# Patient Record
Sex: Female | Born: 1995 | Race: Asian | Hispanic: No | Marital: Single | Smoking: Never smoker
Health system: Southern US, Community
[De-identification: ages and names within clinical notes are randomized; demographics above are authoritative.]

## PROBLEM LIST (undated history)

## (undated) DIAGNOSIS — Z789 Other specified health status: Secondary | ICD-10-CM

## (undated) HISTORY — PX: NO PAST SURGERIES: SHX2092

## (undated) HISTORY — DX: Other specified health status: Z78.9

---

## 2014-08-20 NOTE — L&D Delivery Note (Cosign Needed)
Delivery Note At 2:30 PM a viable female was delivered via Vaginal, Spontaneous Delivery (Presentation: Left Occiput Anterior).  APGAR: 7, 9; weight 6 lb 9.5 oz (2990 g).   Placenta status: Intact, Spontaneous.  Cord: 3 vessels with the following complications: meconium-stained fluid.  Cord pH: not obtained  Anesthesia: Epidural  Episiotomy: None Lacerations: 1st degree Suture Repair: 3-0 vicryl for perineal laceration and 4-0 vicryl for right labial laceration Est. Blood Loss (mL): 153  Mom to postpartum.  Baby to Couplet care / Skin to Skin.  Cherrie Gauze Ayvin Lipinski 04/20/2015, 5:24 PM

## 2014-09-29 ENCOUNTER — Encounter: Payer: Self-pay | Admitting: General Practice

## 2014-09-29 ENCOUNTER — Telehealth: Payer: Self-pay | Admitting: General Practice

## 2014-09-29 NOTE — Telephone Encounter (Signed)
Patient needs to be made aware of new OB appt. Called patient at listed number and a woman answered stating she was her personal interpreter because Zyasia doesn't speak english. Asked her for patient's phone number and she states that the patient isn't going to want to use any other interpreter because she has already said that. Told her that if the patient wants to do that, that is fine but we can only speak to her using our provided interpreters at this time due to HIPAA. She provided number of [phone] and states patient is in class from 10am-2pm.

## 2014-10-06 ENCOUNTER — Encounter: Payer: Self-pay | Admitting: *Deleted

## 2014-10-06 NOTE — Telephone Encounter (Signed)
[facility] # [phone] and informed her of appt on 3/10 @ 1005. Pt was also advised that a qualified medical interpreter will be provided for her either in person or by phone at each scheduled visit.  Pt voiced understanding of all information given.

## 2014-10-28 ENCOUNTER — Ambulatory Visit (INDEPENDENT_AMBULATORY_CARE_PROVIDER_SITE_OTHER): Payer: Self-pay | Admitting: Family Medicine

## 2014-10-28 ENCOUNTER — Encounter: Payer: Self-pay | Admitting: Family Medicine

## 2014-10-28 ENCOUNTER — Other Ambulatory Visit: Payer: Self-pay | Admitting: Family Medicine

## 2014-10-28 VITALS — BP 106/64 | HR 89 | Temp 98.1°F | Ht 64.0 in | Wt 121.8 lb

## 2014-10-28 DIAGNOSIS — Z3400 Encounter for supervision of normal first pregnancy, unspecified trimester: Secondary | ICD-10-CM | POA: Insufficient documentation

## 2014-10-28 DIAGNOSIS — Z3402 Encounter for supervision of normal first pregnancy, second trimester: Secondary | ICD-10-CM

## 2014-10-28 LAB — POCT URINALYSIS DIP (DEVICE)
Glucose, UA: NEGATIVE mg/dL
Ketones, ur: NEGATIVE mg/dL
Leukocytes, UA: NEGATIVE
Nitrite: NEGATIVE
Protein, ur: 30 mg/dL — AB
SPECIFIC GRAVITY, URINE: 1.025 (ref 1.005–1.030)
UROBILINOGEN UA: 0.2 mg/dL (ref 0.0–1.0)
pH: 7 (ref 5.0–8.0)

## 2014-10-28 LAB — OB RESULTS CONSOLE GC/CHLAMYDIA
Chlamydia: NEGATIVE
GC PROBE AMP, GENITAL: NEGATIVE

## 2014-10-28 NOTE — Progress Notes (Signed)
   Subjective:    Alicia Hutchinson is a G1P0000 5524w2d being seen today for her first obstetrical visit.  Her obstetrical history is not significant. Pregnancy history fully reviewed.  Patient reports no complaints.  Filed Vitals:   10/28/14 1036 10/28/14 1038  BP: 106/64   Pulse: 89   Temp: 98.1 F (36.7 C)   Height:  5\' 4"  (1.626 m)  Weight: 121 lb 12.8 oz (55.248 kg)     HISTORY: OB History  Gravida Para Term Preterm AB SAB TAB Ectopic Multiple Living  1 0 0 0 0 0 0 0 0 0     # Outcome Date GA Lbr Len/2nd Weight Sex Delivery Anes PTL Lv  1 Current              Past Medical History  Diagnosis Date  . Medical history non-contributory    Past Surgical History  Procedure Laterality Date  . No past surgeries     History reviewed. No pertinent family history.   Exam     Skin: normal coloration and turgor, no rashes    Neurologic: oriented   Extremities: normal strength, tone, and muscle mass   HEENT sclera clear, anicteric   Mouth/Teeth mucous membranes moist, pharynx normal without lesions and dental hygiene good   Neck supple   Cardiovascular: regular rate and rhythm, no murmurs or gallops   Respiratory:  appears well, vitals normal, no respiratory distress, acyanotic, normal RR, ear and throat exam is normal, neck free of mass or lymphadenopathy, chest clear, no wheezing, crepitations, rhonchi, normal symmetric air entry   Abdomen: soft, non-tender; bowel sounds normal; no masses,  no organomegaly      Assessment:    Pregnancy: G1P0000 Patient Active Problem List   Diagnosis Date Noted  . Supervision of normal first pregnancy, antepartum 10/28/2014        Plan:     Initial labs drawn. Prenatal vitamins. Problem list reviewed and updated. Genetic Screening discussed Quad Screen: undecided. Too late for first screen-->if desires quad at next visit Ultrasound discussed; fetal survey: discussed--order next visit. Declines flu today-->if consents, give at next  visit.  Follow up in 4 weeks.    Ariyannah Pauling S 10/28/2014

## 2014-10-28 NOTE — Progress Notes (Signed)
[place]ou used for interpreter

## 2014-10-28 NOTE — Patient Instructions (Signed)
Second Trimester of Pregnancy The second trimester is from week 13 through week 28, months 4 through 6. The second trimester is often a time when you feel your best. Your body has also adjusted to being pregnant, and you begin to feel better physically. Usually, morning sickness has lessened or quit completely, you may have more energy, and you may have an increase in appetite. The second trimester is also a time when the fetus is growing rapidly. At the end of the sixth month, the fetus is about 9 inches long and weighs about 1 pounds. You will likely begin to feel the baby move (quickening) between 18 and 20 weeks of the pregnancy. BODY CHANGES Your body goes through many changes during pregnancy. The changes vary from woman to woman.   Your weight will continue to increase. You will notice your lower abdomen bulging out.  You may begin to get stretch marks on your hips, abdomen, and breasts.  You may develop headaches that can be relieved by medicines approved by your health care provider.  You may urinate more often because the fetus is pressing on your bladder.  You may develop or continue to have heartburn as a result of your pregnancy.  You may develop constipation because certain hormones are causing the muscles that push waste through your intestines to slow down.  You may develop hemorrhoids or swollen, bulging veins (varicose veins).  You may have back pain because of the weight gain and pregnancy hormones relaxing your joints between the bones in your pelvis and as a result of a shift in weight and the muscles that support your balance.  Your breasts will continue to grow and be tender.  Your gums may bleed and may be sensitive to brushing and flossing.  Dark spots or blotches (chloasma, mask of pregnancy) may develop on your face. This will likely fade after the baby is born.  A dark line from your belly button to the pubic area (linea nigra) may appear. This will likely  fade after the baby is born.  You may have changes in your hair. These can include thickening of your hair, rapid growth, and changes in texture. Some women also have hair loss during or after pregnancy, or hair that feels dry or thin. Your hair will most likely return to normal after your baby is born. WHAT TO EXPECT AT YOUR PRENATAL VISITS During a routine prenatal visit:  You will be weighed to make sure you and the fetus are growing normally.  Your blood pressure will be taken.  Your abdomen will be measured to track your baby's growth.  The fetal heartbeat will be listened to.  Any test results from the previous visit will be discussed. Your health care provider may ask you:  How you are feeling.  If you are feeling the baby move.  If you have had any abnormal symptoms, such as leaking fluid, bleeding, severe headaches, or abdominal cramping.  If you have any questions. Other tests that may be performed during your second trimester include:  Blood tests that check for:  Low iron levels (anemia).  Gestational diabetes (between 24 and 28 weeks).  Rh antibodies.  Urine tests to check for infections, diabetes, or protein in the urine.  An ultrasound to confirm the proper growth and development of the baby.  An amniocentesis to check for possible genetic problems.  Fetal screens for spina bifida and Down syndrome. HOME CARE INSTRUCTIONS   Avoid all smoking, herbs, alcohol, and unprescribed   drugs. These chemicals affect the formation and growth of the baby.  Follow your health care provider's instructions regarding medicine use. There are medicines that are either safe or unsafe to take during pregnancy.  Exercise only as directed by your health care provider. Experiencing uterine cramps is a good sign to stop exercising.  Continue to eat regular, healthy meals.  Wear a good support bra for breast tenderness.  Do not use hot tubs, steam rooms, or saunas.  Wear  your seat belt at all times when driving.  Avoid raw meat, uncooked cheese, cat litter boxes, and soil used by cats. These carry germs that can cause birth defects in the baby.  Take your prenatal vitamins.  Try taking a stool softener (if your health care provider approves) if you develop constipation. Eat more high-fiber foods, such as fresh vegetables or fruit and whole grains. Drink plenty of fluids to keep your urine clear or pale yellow.  Take warm sitz baths to soothe any pain or discomfort caused by hemorrhoids. Use hemorrhoid cream if your health care provider approves.  If you develop varicose veins, wear support hose. Elevate your feet for 15 minutes, 3-4 times a day. Limit salt in your diet.  Avoid heavy lifting, wear low heel shoes, and practice good posture.  Rest with your legs elevated if you have leg cramps or low back pain.  Visit your dentist if you have not gone yet during your pregnancy. Use a soft toothbrush to brush your teeth and be gentle when you floss.  A sexual relationship may be continued unless your health care provider directs you otherwise.  Continue to go to all your prenatal visits as directed by your health care provider. SEEK MEDICAL CARE IF:   You have dizziness.  You have mild pelvic cramps, pelvic pressure, or nagging pain in the abdominal area.  You have persistent nausea, vomiting, or diarrhea.  You have a bad smelling vaginal discharge.  You have pain with urination. SEEK IMMEDIATE MEDICAL CARE IF:   You have a fever.  You are leaking fluid from your vagina.  You have spotting or bleeding from your vagina.  You have severe abdominal cramping or pain.  You have rapid weight gain or loss.  You have shortness of breath with chest pain.  You notice sudden or extreme swelling of your face, hands, ankles, feet, or legs.  You have not felt your baby move in over an hour.  You have severe headaches that do not go away with  medicine.  You have vision changes. Document Released: 07/31/2001 Document Revised: 08/11/2013 Document Reviewed: 10/07/2012 Exit[employer] Patient Information 2015 [employer]. This information is not intended to replace advice given to you by your health care provider. Make sure you discuss any questions you have with your health care provider.  Breastfeeding Deciding to breastfeed is one of the best choices you can make for you and your baby. A change in hormones during pregnancy causes your breast tissue to grow and increases the number and size of your milk ducts. These hormones also allow proteins, sugars, and fats from your blood supply to make breast milk in your milk-producing glands. Hormones prevent breast milk from being released before your baby is born as well as prompt milk flow after birth. Once breastfeeding has begun, thoughts of your baby, as well as his or her sucking or crying, can stimulate the release of milk from your milk-producing glands.  BENEFITS OF BREASTFEEDING For Your Baby  Your first   milk (colostrum) helps your baby's digestive system function better.   There are antibodies in your milk that help your baby fight off infections.   Your baby has a lower incidence of asthma, allergies, and sudden infant death syndrome.   The nutrients in breast milk are better for your baby than infant formulas and are designed uniquely for your baby's needs.   Breast milk improves your baby's brain development.   Your baby is less likely to develop other conditions, such as childhood obesity, asthma, or type 2 diabetes mellitus.  For You   Breastfeeding helps to create a very special bond between you and your baby.   Breastfeeding is convenient. Breast milk is always available at the correct temperature and costs nothing.   Breastfeeding helps to burn calories and helps you lose the weight gained during pregnancy.   Breastfeeding makes your uterus contract to its  prepregnancy size faster and slows bleeding (lochia) after you give birth.   Breastfeeding helps to lower your risk of developing type 2 diabetes mellitus, osteoporosis, and breast or ovarian cancer later in life. SIGNS THAT YOUR BABY IS HUNGRY Early Signs of Hunger  Increased alertness or activity.  Stretching.  Movement of the head from side to side.  Movement of the head and opening of the mouth when the corner of the mouth or cheek is stroked (rooting).  Increased sucking sounds, smacking lips, cooing, sighing, or squeaking.  Hand-to-mouth movements.  Increased sucking of fingers or hands. Late Signs of Hunger  Fussing.  Intermittent crying. Extreme Signs of Hunger Signs of extreme hunger will require calming and consoling before your baby will be able to breastfeed successfully. Do not wait for the following signs of extreme hunger to occur before you initiate breastfeeding:   Restlessness.  A loud, strong cry.   Screaming. BREASTFEEDING BASICS Breastfeeding Initiation  Find a comfortable place to sit or lie down, with your neck and back well supported.  Place a pillow or rolled up blanket under your baby to bring him or her to the level of your breast (if you are seated). Nursing pillows are specially designed to help support your arms and your baby while you breastfeed.  Make sure that your baby's abdomen is facing your abdomen.   Gently massage your breast. With your fingertips, massage from your chest wall toward your nipple in a circular motion. This encourages milk flow. You may need to continue this action during the feeding if your milk flows slowly.  Support your breast with 4 fingers underneath and your thumb above your nipple. Make sure your fingers are well away from your nipple and your baby's mouth.   Stroke your baby's lips gently with your finger or nipple.   When your baby's mouth is open wide enough, quickly bring your baby to your breast,  placing your entire nipple and as much of the colored area around your nipple (areola) as possible into your baby's mouth.   More areola should be visible above your baby's upper lip than below the lower lip.   Your baby's tongue should be between his or her lower gum and your breast.   Ensure that your baby's mouth is correctly positioned around your nipple (latched). Your baby's lips should create a seal on your breast and be turned out (everted).  It is common for your baby to suck about 2-3 minutes in order to start the flow of breast milk. Latching Teaching your baby how to latch on to your breast   properly is very important. An improper latch can cause nipple pain and decreased milk supply for you and poor weight gain in your baby. Also, if your baby is not latched onto your nipple properly, Bazen or she may swallow some air during feeding. This can make your baby fussy. Burping your baby when you switch breasts during the feeding can help to get rid of the air. However, teaching your baby to latch on properly is still the best way to prevent fussiness from swallowing air while breastfeeding. Signs that your baby has successfully latched on to your nipple:    Silent tugging or silent sucking, without causing you pain.   Swallowing heard between every 3-4 sucks.    Muscle movement above and in front of his or her ears while sucking.  Signs that your baby has not successfully latched on to nipple:   Sucking sounds or smacking sounds from your baby while breastfeeding.  Nipple pain. If you think your baby has not latched on correctly, slip your finger into the corner of your baby's mouth to break the suction and place it between your baby's gums. Attempt breastfeeding initiation again. Signs of Successful Breastfeeding Signs from your baby:   A gradual decrease in the number of sucks or complete cessation of sucking.   Falling asleep.   Relaxation of his or her body.    Retention of a small amount of milk in his or her mouth.   Letting go of your breast by himself or herself. Signs from you:  Breasts that have increased in firmness, weight, and size 1-3 hours after feeding.   Breasts that are softer immediately after breastfeeding.  Increased milk volume, as well as a change in milk consistency and color by the fifth day of breastfeeding.   Nipples that are not sore, cracked, or bleeding. Signs That Your Baby is Getting Enough Milk  Wetting at least 3 diapers in a 24-hour period. The urine should be clear and pale yellow by age 5 days.  At least 3 stools in a 24-hour period by age 5 days. The stool should be soft and yellow.  At least 3 stools in a 24-hour period by age 7 days. The stool should be seedy and yellow.  No loss of weight greater than 10% of birth weight during the first 3 days of age.  Average weight gain of 4-7 ounces (113-198 g) per week after age 4 days.  Consistent daily weight gain by age 5 days, without weight loss after the age of 2 weeks. After a feeding, your baby may spit up a small amount. This is common. BREASTFEEDING FREQUENCY AND DURATION Frequent feeding will help you make more milk and can prevent sore nipples and breast engorgement. Breastfeed when you feel the need to reduce the fullness of your breasts or when your baby shows signs of hunger. This is called "breastfeeding on demand." Avoid introducing a pacifier to your baby while you are working to establish breastfeeding (the first 4-6 weeks after your baby is born). After this time you may choose to use a pacifier. Research has shown that pacifier use during the first year of a baby's life decreases the risk of sudden infant death syndrome (SIDS). Allow your baby to feed on each breast as long as Doerr or she wants. Breastfeed until your baby is finished feeding. When your baby unlatches or falls asleep while feeding from the first breast, offer the second breast.  Because newborns are often sleepy in the   first few weeks of life, you may need to awaken your baby to get him or her to feed. Breastfeeding times will vary from baby to baby. However, the following rules can serve as a guide to help you ensure that your baby is properly fed:  Newborns (babies 4 weeks of age or younger) may breastfeed every 1-3 hours.  Newborns should not go longer than 3 hours during the day or 5 hours during the night without breastfeeding.  You should breastfeed your baby a minimum of 8 times in a 24-hour period until you begin to introduce solid foods to your baby at around 6 months of age. BREAST MILK PUMPING Pumping and storing breast milk allows you to ensure that your baby is exclusively fed your breast milk, even at times when you are unable to breastfeed. This is especially important if you are going back to work while you are still breastfeeding or when you are not able to be present during feedings. Your lactation consultant can give you guidelines on how long it is safe to store breast milk.  A breast pump is a machine that allows you to pump milk from your breast into a sterile bottle. The pumped breast milk can then be stored in a refrigerator or freezer. Some breast pumps are operated by hand, while others use electricity. Ask your lactation consultant which type will work best for you. Breast pumps can be purchased, but some hospitals and breastfeeding support groups lease breast pumps on a monthly basis. A lactation consultant can teach you how to hand express breast milk, if you prefer not to use a pump.  CARING FOR YOUR BREASTS WHILE YOU BREASTFEED Nipples can become dry, cracked, and sore while breastfeeding. The following recommendations can help keep your breasts moisturized and healthy:  Avoid using soap on your nipples.   Wear a supportive bra. Although not required, special nursing bras and tank tops are designed to allow access to your breasts for  breastfeeding without taking off your entire bra or top. Avoid wearing underwire-style bras or extremely tight bras.  Air dry your nipples for 3-4minutes after each feeding.   Use only cotton bra pads to absorb leaked breast milk. Leaking of breast milk between feedings is normal.   Use lanolin on your nipples after breastfeeding. Lanolin helps to maintain your skin's normal moisture barrier. If you use pure lanolin, you do not need to wash it off before feeding your baby again. Pure lanolin is not toxic to your baby. You may also hand express a few drops of breast milk and gently massage that milk into your nipples and allow the milk to air dry. In the first few weeks after giving birth, some women experience extremely full breasts (engorgement). Engorgement can make your breasts feel heavy, warm, and tender to the touch. Engorgement peaks within 3-5 days after you give birth. The following recommendations can help ease engorgement:  Completely empty your breasts while breastfeeding or pumping. You may want to start by applying warm, moist heat (in the shower or with warm water-soaked hand towels) just before feeding or pumping. This increases circulation and helps the milk flow. If your baby does not completely empty your breasts while breastfeeding, pump any extra milk after Brine or she is finished.  Wear a snug bra (nursing or regular) or tank top for 1-2 days to signal your body to slightly decrease milk production.  Apply ice packs to your breasts, unless this is too uncomfortable for you.    Make sure that your baby is latched on and positioned properly while breastfeeding. If engorgement persists after 48 hours of following these recommendations, contact your health care provider or a lactation consultant. OVERALL HEALTH CARE RECOMMENDATIONS WHILE BREASTFEEDING  Eat healthy foods. Alternate between meals and snacks, eating 3 of each per day. Because what you eat affects your breast milk,  some of the foods may make your baby more irritable than usual. Avoid eating these foods if you are sure that they are negatively affecting your baby.  Drink milk, fruit juice, and water to satisfy your thirst (about 10 glasses a day).   Rest often, relax, and continue to take your prenatal vitamins to prevent fatigue, stress, and anemia.  Continue breast self-awareness checks.  Avoid chewing and smoking tobacco.  Avoid alcohol and drug use. Some medicines that may be harmful to your baby can pass through breast milk. It is important to ask your health care provider before taking any medicine, including all over-the-counter and prescription medicine as well as vitamin and herbal supplements. It is possible to become pregnant while breastfeeding. If birth control is desired, ask your health care provider about options that will be safe for your baby. SEEK MEDICAL CARE IF:   You feel like you want to stop breastfeeding or have become frustrated with breastfeeding.  You have painful breasts or nipples.  Your nipples are cracked or bleeding.  Your breasts are red, tender, or warm.  You have a swollen area on either breast.  You have a fever or chills.  You have nausea or vomiting.  You have drainage other than breast milk from your nipples.  Your breasts do not become full before feedings by the fifth day after you give birth.  You feel sad and depressed.  Your baby is too sleepy to eat well.  Your baby is having trouble sleeping.   Your baby is wetting less than 3 diapers in a 24-hour period.  Your baby has less than 3 stools in a 24-hour period.  Your baby's skin or the white part of his or her eyes becomes yellow.   Your baby is not gaining weight by 5 days of age. SEEK IMMEDIATE MEDICAL CARE IF:   Your baby is overly tired (lethargic) and does not want to wake up and feed.  Your baby develops an unexplained fever. Document Released: 08/06/2005 Document Revised:  08/11/2013 Document Reviewed: 01/28/2013 [employer] Patient Information 2015 [employer]. This information is not intended to replace advice given to you by your health care provider. Make sure you discuss any questions you have with your health care provider.  

## 2014-10-29 LAB — PRENATAL PROFILE (SOLSTAS)
ANTIBODY SCREEN: NEGATIVE
BASOS ABS: 0 10*3/uL (ref 0.0–0.1)
Basophils Relative: 0 % (ref 0–1)
EOS PCT: 0 % (ref 0–5)
Eosinophils Absolute: 0 10*3/uL (ref 0.0–0.7)
HCT: 39.6 % (ref 36.0–46.0)
HEP B S AG: NEGATIVE
HIV 1&2 Ab, 4th Generation: NONREACTIVE
Hemoglobin: 13.4 g/dL (ref 12.0–15.0)
LYMPHS ABS: 1.7 10*3/uL (ref 0.7–4.0)
Lymphocytes Relative: 15 % (ref 12–46)
MCH: 32.1 pg (ref 26.0–34.0)
MCHC: 33.8 g/dL (ref 30.0–36.0)
MCV: 95 fL (ref 78.0–100.0)
MPV: 10.4 fL (ref 8.6–12.4)
Monocytes Absolute: 0.8 10*3/uL (ref 0.1–1.0)
Monocytes Relative: 7 % (ref 3–12)
NEUTROS ABS: 9 10*3/uL — AB (ref 1.7–7.7)
NEUTROS PCT: 78 % — AB (ref 43–77)
PLATELETS: 209 10*3/uL (ref 150–400)
RBC: 4.17 MIL/uL (ref 3.87–5.11)
RDW: 13.5 % (ref 11.5–15.5)
Rh Type: POSITIVE
Rubella: 1.29 Index — ABNORMAL HIGH (ref <0.90)
WBC: 11.6 10*3/uL — ABNORMAL HIGH (ref 4.0–10.5)

## 2014-10-29 LAB — GC/CHLAMYDIA PROBE AMP
CT Probe RNA: NEGATIVE
GC Probe RNA: NEGATIVE

## 2014-10-29 LAB — CULTURE, OB URINE
Colony Count: NO GROWTH
Organism ID, Bacteria: NO GROWTH

## 2014-11-02 LAB — PRESCRIPTION MONITORING PROFILE (19 PANEL)
Amphetamine/Meth: NEGATIVE ng/mL
Barbiturate Screen, Urine: NEGATIVE ng/mL
Benzodiazepine Screen, Urine: NEGATIVE ng/mL
Buprenorphine, Urine: NEGATIVE ng/mL
CANNABINOID SCRN UR: NEGATIVE ng/mL
CARISOPRODOL, URINE: NEGATIVE ng/mL
COCAINE METABOLITES: NEGATIVE ng/mL
Creatinine, Urine: 446.68 mg/dL (ref >20.0)
ECSTASY: NEGATIVE ng/mL
FENTANYL URINE: NEGATIVE ng/mL
MEPERIDINE UR: NEGATIVE ng/mL
Methadone Screen, Urine: NEGATIVE ng/mL
Methaqualone: NEGATIVE ng/mL
Nitrites, Initial: NEGATIVE ug/mL
OXYCODONE SCRN UR: NEGATIVE ng/mL
Opiate Screen, Urine: NEGATIVE ng/mL
PHENCYCLIDINE, UR: NEGATIVE ng/mL
Propoxyphene: NEGATIVE ng/mL
Tapentadol, urine: NEGATIVE ng/mL
Tramadol Scrn, Ur: NEGATIVE ng/mL
Zolpidem, Urine: NEGATIVE ng/mL
pH, Initial: 6.7 pH (ref 4.5–8.9)

## 2014-11-04 ENCOUNTER — Encounter: Payer: Self-pay | Admitting: Advanced Practice Midwife

## 2014-11-19 ENCOUNTER — Encounter: Payer: Self-pay | Admitting: Advanced Practice Midwife

## 2014-11-24 ENCOUNTER — Ambulatory Visit (INDEPENDENT_AMBULATORY_CARE_PROVIDER_SITE_OTHER): Payer: Self-pay | Admitting: Advanced Practice Midwife

## 2014-11-24 VITALS — BP 109/65 | HR 71 | Temp 98.0°F | Wt 126.3 lb

## 2014-11-24 DIAGNOSIS — Z23 Encounter for immunization: Secondary | ICD-10-CM

## 2014-11-24 DIAGNOSIS — Z3402 Encounter for supervision of normal first pregnancy, second trimester: Secondary | ICD-10-CM

## 2014-11-24 LAB — POCT URINALYSIS DIP (DEVICE)
Bilirubin Urine: NEGATIVE
GLUCOSE, UA: NEGATIVE mg/dL
Hgb urine dipstick: NEGATIVE
Ketones, ur: NEGATIVE mg/dL
Leukocytes, UA: NEGATIVE
Nitrite: NEGATIVE
PROTEIN: NEGATIVE mg/dL
Specific Gravity, Urine: 1.015 (ref 1.005–1.030)
UROBILINOGEN UA: 0.2 mg/dL (ref 0.0–1.0)
pH: 7 (ref 5.0–8.0)

## 2014-11-24 NOTE — Progress Notes (Signed)
Flu vaccine today 

## 2014-11-24 NOTE — Progress Notes (Signed)
Doing well.  Good fetal movement, denies vaginal bleeding, LOF, cramping/contractions.  Reports skin breakouts on her face and cramping of her thighs.  Recommend increased water intake, stretching/warm bath before bed if cramping at night.  If worsening, let clinic know, may add magnesium supplement for leg cramping.

## 2014-11-25 ENCOUNTER — Encounter: Payer: Self-pay | Admitting: Family Medicine

## 2014-11-25 ENCOUNTER — Telehealth: Payer: Self-pay

## 2014-11-25 NOTE — Telephone Encounter (Signed)
Called patient in regards to my chart message which stated she has been experiencing sore throat, headache, muscle aches, and chest tightness since yesterday after receiving the flu shot. Patient reports she is feeling "much better" and symptoms have subsided. Informed her muscle aches, headaches are normal side effects to flu vaccine. However, advised that chest tightness and SOB are typically not-- therefore if it occurs again she should be evaluated. Patient verbalized understanding and gratitude and re-itterated that she is feeling better. No further questions or concerns.

## 2014-11-30 ENCOUNTER — Ambulatory Visit (HOSPITAL_COMMUNITY)
Admission: RE | Admit: 2014-11-30 | Discharge: 2014-11-30 | Disposition: A | Discharge status: Home / Self Care | Payer: Self-pay | Source: Ambulatory Visit | Attending: Advanced Practice Midwife | Admitting: Advanced Practice Midwife

## 2014-11-30 DIAGNOSIS — Z36 Encounter for antenatal screening of mother: Secondary | ICD-10-CM | POA: Insufficient documentation

## 2014-11-30 DIAGNOSIS — Z3689 Encounter for other specified antenatal screening: Secondary | ICD-10-CM | POA: Insufficient documentation

## 2014-11-30 DIAGNOSIS — Z3402 Encounter for supervision of normal first pregnancy, second trimester: Secondary | ICD-10-CM

## 2014-11-30 DIAGNOSIS — Z3A19 19 weeks gestation of pregnancy: Secondary | ICD-10-CM | POA: Insufficient documentation

## 2014-12-20 ENCOUNTER — Ambulatory Visit (INDEPENDENT_AMBULATORY_CARE_PROVIDER_SITE_OTHER): Payer: Self-pay | Admitting: Family Medicine

## 2014-12-20 VITALS — BP 97/53 | HR 70 | Temp 98.4°F | Wt 134.3 lb

## 2014-12-20 DIAGNOSIS — Z3492 Encounter for supervision of normal pregnancy, unspecified, second trimester: Secondary | ICD-10-CM

## 2014-12-20 DIAGNOSIS — Z331 Pregnant state, incidental: Secondary | ICD-10-CM

## 2014-12-20 LAB — POCT URINALYSIS DIP (DEVICE)
Bilirubin Urine: NEGATIVE
Glucose, UA: NEGATIVE mg/dL
Hgb urine dipstick: NEGATIVE
KETONES UR: NEGATIVE mg/dL
Nitrite: NEGATIVE
PH: 7 (ref 5.0–8.0)
PROTEIN: NEGATIVE mg/dL
SPECIFIC GRAVITY, URINE: 1.01 (ref 1.005–1.030)
Urobilinogen, UA: 0.2 mg/dL (ref 0.0–1.0)

## 2014-12-20 NOTE — Progress Notes (Signed)
OB f/u US scheduled for May 17th @ 0945

## 2014-12-20 NOTE — Progress Notes (Signed)
Used Interpreter Alicia BeersEdith Hutchinson . Alicia Hutchinson c/o pain in legs.

## 2014-12-20 NOTE — Addendum Note (Signed)
Addended by: Kathee DeltonHILLMAN, Kea Callan L on: 12/20/2014 04:23 PM   Modules accepted: Orders

## 2014-12-20 NOTE — Progress Notes (Signed)
Patient is 19 y.o. G1P0000 2128w6d.  +FM, denies VB, vaginal discharge.  Overall feeling well. - Quad to be drawn today as was not previous - sono ordered to follow up incomplete anatomy

## 2014-12-22 LAB — AFP, QUAD SCREEN
AFP: 68.3 ng/mL
Age Alone: 1:1180 {titer}
Curr Gest Age: 21.6 wks.days
HCG TOTAL: 18.91 [IU]/mL
INH: 264.9 pg/mL
INTERPRETATION-AFP: NEGATIVE
MoM for AFP: 0.73
MoM for INH: 1.19
MoM for hCG: 0.71
OPEN SPINA BIFIDA: NEGATIVE
Osb Risk: <1:27300 {titer}
Tri 18 Scr Risk Est: NEGATIVE
Trisomy 18 (Edward) Syndrome Interp.: 1:7870 {titer}
UE3 MOM: 0.65
UE3 VALUE: 2.02 ng/mL

## 2015-01-04 ENCOUNTER — Ambulatory Visit (HOSPITAL_COMMUNITY)
Admission: RE | Admit: 2015-01-04 | Discharge: 2015-01-04 | Disposition: A | Discharge status: Home / Self Care | Payer: Self-pay | Source: Ambulatory Visit | Attending: Family Medicine | Admitting: Family Medicine

## 2015-01-04 DIAGNOSIS — Z3492 Encounter for supervision of normal pregnancy, unspecified, second trimester: Secondary | ICD-10-CM | POA: Insufficient documentation

## 2015-01-20 ENCOUNTER — Encounter: Payer: Self-pay | Admitting: Advanced Practice Midwife

## 2015-01-20 ENCOUNTER — Ambulatory Visit (INDEPENDENT_AMBULATORY_CARE_PROVIDER_SITE_OTHER): Payer: Self-pay | Admitting: Advanced Practice Midwife

## 2015-01-20 VITALS — BP 116/63 | HR 67 | Temp 97.4°F | Wt 142.6 lb

## 2015-01-20 DIAGNOSIS — Z3402 Encounter for supervision of normal first pregnancy, second trimester: Secondary | ICD-10-CM

## 2015-01-20 DIAGNOSIS — Z3492 Encounter for supervision of normal pregnancy, unspecified, second trimester: Secondary | ICD-10-CM

## 2015-01-20 LAB — POCT URINALYSIS DIP (DEVICE)
Bilirubin Urine: NEGATIVE
Glucose, UA: NEGATIVE mg/dL
Hgb urine dipstick: NEGATIVE
KETONES UR: NEGATIVE mg/dL
Leukocytes, UA: NEGATIVE
NITRITE: NEGATIVE
PH: 5.5 (ref 5.0–8.0)
Protein, ur: NEGATIVE mg/dL
Specific Gravity, Urine: 1.02 (ref 1.005–1.030)
Urobilinogen, UA: 0.2 mg/dL (ref 0.0–1.0)

## 2015-01-20 LAB — CBC
HEMATOCRIT: 35.6 % — AB (ref 36.0–46.0)
Hemoglobin: 11.9 g/dL — ABNORMAL LOW (ref 12.0–15.0)
MCH: 32.6 pg (ref 26.0–34.0)
MCHC: 33.4 g/dL (ref 30.0–36.0)
MCV: 97.5 fL (ref 78.0–100.0)
MPV: 10.3 fL (ref 8.6–12.4)
Platelets: 214 10*3/uL (ref 150–400)
RBC: 3.65 MIL/uL — AB (ref 3.87–5.11)
RDW: 13.2 % (ref 11.5–15.5)
WBC: 13 10*3/uL — ABNORMAL HIGH (ref 4.0–10.5)

## 2015-01-20 MED ORDER — TETANUS-DIPHTH-ACELL PERTUSSIS 5-2.5-18.5 LF-MCG/0.5 IM SUSP: 0.5000 mL | Freq: Once | INTRAMUSCULAR | Status: DC

## 2015-01-20 NOTE — Progress Notes (Signed)
28 wks labs with 1 hour gtt Declined Tdap vaccine Interpreter present for encounter Breastfeeding Tips reviewed with interpreter

## 2015-01-20 NOTE — Progress Notes (Signed)
Glucola done today with 28 wk labs. Feels well good FM.  Interpretor present. Pt and husband are here from [place] to study at [employer].  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

## 2015-01-20 NOTE — Patient Instructions (Signed)
Second Trimester of Pregnancy The second trimester is from week 13 through week 28, months 4 through 6. The second trimester is often a time when you feel your best. Your body has also adjusted to being pregnant, and you begin to feel better physically. Usually, morning sickness has lessened or quit completely, you may have more energy, and you may have an increase in appetite. The second trimester is also a time when the fetus is growing rapidly. At the end of the sixth month, the fetus is about 9 inches long and weighs about 1 pounds. You will likely begin to feel the baby move (quickening) between 18 and 20 weeks of the pregnancy. BODY CHANGES Your body goes through many changes during pregnancy. The changes vary from woman to woman.   Your weight will continue to increase. You will notice your lower abdomen bulging out.  You may begin to get stretch marks on your hips, abdomen, and breasts.  You may develop headaches that can be relieved by medicines approved by your health care provider.  You may urinate more often because the fetus is pressing on your bladder.  You may develop or continue to have heartburn as a result of your pregnancy.  You may develop constipation because certain hormones are causing the muscles that push waste through your intestines to slow down.  You may develop hemorrhoids or swollen, bulging veins (varicose veins).  You may have back pain because of the weight gain and pregnancy hormones relaxing your joints between the bones in your pelvis and as a result of a shift in weight and the muscles that support your balance.  Your breasts will continue to grow and be tender.  Your gums may bleed and may be sensitive to brushing and flossing.  Dark spots or blotches (chloasma, mask of pregnancy) may develop on your face. This will likely fade after the baby is born.  A dark line from your belly button to the pubic area (linea nigra) may appear. This will likely fade  after the baby is born.  You may have changes in your hair. These can include thickening of your hair, rapid growth, and changes in texture. Some women also have hair loss during or after pregnancy, or hair that feels dry or thin. Your hair will most likely return to normal after your baby is born. WHAT TO EXPECT AT YOUR PRENATAL VISITS During a routine prenatal visit:  You will be weighed to make sure you and the fetus are growing normally.  Your blood pressure will be taken.  Your abdomen will be measured to track your baby's growth.  The fetal heartbeat will be listened to.  Any test results from the previous visit will be discussed. Your health care provider may ask you:  How you are feeling.  If you are feeling the baby move.  If you have had any abnormal symptoms, such as leaking fluid, bleeding, severe headaches, or abdominal cramping.  If you have any questions. Other tests that may be performed during your second trimester include:  Blood tests that check for:  Low iron levels (anemia).  Gestational diabetes (between 24 and 28 weeks).  Rh antibodies.  Urine tests to check for infections, diabetes, or protein in the urine.  An ultrasound to confirm the proper growth and development of the baby.  An amniocentesis to check for possible genetic problems.  Fetal screens for spina bifida and Down syndrome. HOME CARE INSTRUCTIONS   Avoid all smoking, herbs, alcohol, and unprescribed   drugs. These chemicals affect the formation and growth of the baby.  Follow your health care provider's instructions regarding medicine use. There are medicines that are either safe or unsafe to take during pregnancy.  Exercise only as directed by your health care provider. Experiencing uterine cramps is a good sign to stop exercising.  Continue to eat regular, healthy meals.  Wear a good support bra for breast tenderness.  Do not use hot tubs, steam rooms, or saunas.  Wear your  seat belt at all times when driving.  Avoid raw meat, uncooked cheese, cat litter boxes, and soil used by cats. These carry germs that can cause birth defects in the baby.  Take your prenatal vitamins.  Try taking a stool softener (if your health care provider approves) if you develop constipation. Eat more high-fiber foods, such as fresh vegetables or fruit and whole grains. Drink plenty of fluids to keep your urine clear or pale yellow.  Take warm sitz baths to soothe any pain or discomfort caused by hemorrhoids. Use hemorrhoid cream if your health care provider approves.  If you develop varicose veins, wear support hose. Elevate your feet for 15 minutes, 3-4 times a day. Limit salt in your diet.  Avoid heavy lifting, wear low heel shoes, and practice good posture.  Rest with your legs elevated if you have leg cramps or low back pain.  Visit your dentist if you have not gone yet during your pregnancy. Use a soft toothbrush to brush your teeth and be gentle when you floss.  A sexual relationship may be continued unless your health care provider directs you otherwise.  Continue to go to all your prenatal visits as directed by your health care provider. SEEK MEDICAL CARE IF:   You have dizziness.  You have mild pelvic cramps, pelvic pressure, or nagging pain in the abdominal area.  You have persistent nausea, vomiting, or diarrhea.  You have a bad smelling vaginal discharge.  You have pain with urination. SEEK IMMEDIATE MEDICAL CARE IF:   You have a fever.  You are leaking fluid from your vagina.  You have spotting or bleeding from your vagina.  You have severe abdominal cramping or pain.  You have rapid weight gain or loss.  You have shortness of breath with chest pain.  You notice sudden or extreme swelling of your face, hands, ankles, feet, or legs.  You have not felt your baby move in over an hour.  You have severe headaches that do not go away with  medicine.  You have vision changes. Document Released: 07/31/2001 Document Revised: 08/11/2013 Document Reviewed: 10/07/2012 ExitCare Patient Information 2015 [employer]. This information is not intended to replace advice given to you by your health care provider. Make sure you discuss any questions you have with your health care provider.  

## 2015-01-21 LAB — RPR

## 2015-01-21 LAB — HIV ANTIBODY (ROUTINE TESTING W REFLEX): HIV: NONREACTIVE

## 2015-01-21 LAB — GLUCOSE TOLERANCE, 1 HOUR (50G) W/O FASTING: Glucose, 1 Hour GTT: 94 mg/dL (ref 70–140)

## 2015-02-10 ENCOUNTER — Ambulatory Visit (INDEPENDENT_AMBULATORY_CARE_PROVIDER_SITE_OTHER): Payer: Self-pay | Admitting: Family

## 2015-02-10 VITALS — BP 121/62 | HR 81 | Wt 147.8 lb

## 2015-02-10 DIAGNOSIS — Z3402 Encounter for supervision of normal first pregnancy, second trimester: Secondary | ICD-10-CM

## 2015-02-10 LAB — POCT URINALYSIS DIP (DEVICE)
Bilirubin Urine: NEGATIVE
GLUCOSE, UA: NEGATIVE mg/dL
Hgb urine dipstick: NEGATIVE
KETONES UR: NEGATIVE mg/dL
Leukocytes, UA: NEGATIVE
Nitrite: NEGATIVE
Protein, ur: NEGATIVE mg/dL
SPECIFIC GRAVITY, URINE: 1.02 (ref 1.005–1.030)
Urobilinogen, UA: 0.2 mg/dL (ref 0.0–1.0)
pH: 7.5 (ref 5.0–8.0)

## 2015-02-10 NOTE — Progress Notes (Signed)
Reports spotting on Saturday x 1.  No trauma to abdomen.  Denies contractions.  +active baby.  Reviewed third trimester labs with patient.   Consulted with Dr. Emelda Fear > obtain cervical length within the week for possible initiation of prometrium.

## 2015-02-10 NOTE — Progress Notes (Signed)
Had some spotting last Saturday.

## 2015-02-11 ENCOUNTER — Other Ambulatory Visit: Payer: Self-pay | Admitting: Family

## 2015-02-11 ENCOUNTER — Ambulatory Visit (HOSPITAL_COMMUNITY): Payer: Self-pay

## 2015-02-11 ENCOUNTER — Ambulatory Visit (HOSPITAL_COMMUNITY)
Admission: RE | Admit: 2015-02-11 | Discharge: 2015-02-11 | Disposition: A | Discharge status: Home / Self Care | Payer: Self-pay | Source: Ambulatory Visit | Attending: Family | Admitting: Family

## 2015-02-11 DIAGNOSIS — O26873 Cervical shortening, third trimester: Secondary | ICD-10-CM

## 2015-02-11 DIAGNOSIS — Z3402 Encounter for supervision of normal first pregnancy, second trimester: Secondary | ICD-10-CM

## 2015-02-11 DIAGNOSIS — Z3A29 29 weeks gestation of pregnancy: Secondary | ICD-10-CM | POA: Insufficient documentation

## 2015-03-01 ENCOUNTER — Encounter: Payer: Self-pay | Admitting: Obstetrics & Gynecology

## 2015-03-01 ENCOUNTER — Ambulatory Visit (INDEPENDENT_AMBULATORY_CARE_PROVIDER_SITE_OTHER): Payer: Self-pay | Admitting: Obstetrics & Gynecology

## 2015-03-01 VITALS — BP 109/63 | HR 86 | Temp 98.5°F | Wt 152.5 lb

## 2015-03-01 DIAGNOSIS — Z3403 Encounter for supervision of normal first pregnancy, third trimester: Secondary | ICD-10-CM

## 2015-03-01 LAB — POCT URINALYSIS DIP (DEVICE)
BILIRUBIN URINE: NEGATIVE
Glucose, UA: NEGATIVE mg/dL
Hgb urine dipstick: NEGATIVE
Ketones, ur: NEGATIVE mg/dL
Nitrite: NEGATIVE
Protein, ur: NEGATIVE mg/dL
Specific Gravity, Urine: 1.015 (ref 1.005–1.030)
Urobilinogen, UA: 1 mg/dL (ref 0.0–1.0)
pH: 7.5 (ref 5.0–8.0)

## 2015-03-01 NOTE — Progress Notes (Signed)
Subjective:  Alicia Hutchinson is a 19 y.o. G1P0000 at 1480w0d being seen today for ongoing prenatal care.  Patient reports no complaints.  Contractions: Not present.  Vag. Bleeding: None. Movement: Present. Denies leaking of fluid.   The following portions of the patient's history were reviewed and updated as appropriate: allergies, current medications, past family history, past medical history, past social history, past surgical history and problem list.   Objective:   Filed Vitals:   03/01/15 1525  BP: 109/63  Pulse: 86  Temp: 98.5 F (36.9 C)  Weight: 152 lb 8 oz (69.174 kg)    Fetal Status: Fetal Heart Rate (bpm): 159 Fundal Height: 31 cm Movement: Present     General:  Alert, oriented and cooperative. Patient is in no acute distress.  Skin: Skin is warm and dry. No rash noted.   Cardiovascular: Normal heart rate noted  Respiratory: Normal respiratory effort, no problems with respiration noted  Abdomen: Soft, gravid, appropriate for gestational age. Pain/Pressure: Present     Vaginal: Vag. Bleeding: None.    Vag D/C Character: Mucous  Cervix: Not evaluated        Extremities: Normal range of motion.  Edema: None  Mental Status: Normal mood and affect. Normal behavior. Normal judgment and thought content.   Urinalysis: Urine Protein: Negative Urine Glucose: Negative  Assessment and Plan:  Pregnancy: G1P0000 at 6980w0d  1. Supervision of normal first pregnancy, antepartum, third trimester No problems today Will get Tdap from [facility]   Preterm labor symptoms and general obstetric precautions including but not limited to vaginal bleeding, contractions, leaking of fluid and fetal movement were reviewed in detail with the patient.  Please refer to After Visit Summary for other counseling recommendations.   Return in about 2 weeks (around 03/15/2015).   Willodean Rosenthalarolyn Harraway-Smith, MD

## 2015-03-01 NOTE — Patient Instructions (Signed)
Third Trimester of Pregnancy The third trimester is from week 29 through week 42, months 7 through 9. The third trimester is a time when the fetus is growing rapidly. At the end of the ninth month, the fetus is about 20 inches in length and weighs 6-10 pounds.  BODY CHANGES Your body goes through many changes during pregnancy. The changes vary from woman to woman.   Your weight will continue to increase. You can expect to gain 25-35 pounds (11-16 kg) by the end of the pregnancy.  You may begin to get stretch marks on your hips, abdomen, and breasts.  You may urinate more often because the fetus is moving lower into your pelvis and pressing on your bladder.  You may develop or continue to have heartburn as a result of your pregnancy.  You may develop constipation because certain hormones are causing the muscles that push waste through your intestines to slow down.  You may develop hemorrhoids or swollen, bulging veins (varicose veins).  You may have pelvic pain because of the weight gain and pregnancy hormones relaxing your joints between the bones in your pelvis. Backaches may result from overexertion of the muscles supporting your posture.  You may have changes in your hair. These can include thickening of your hair, rapid growth, and changes in texture. Some women also have hair loss during or after pregnancy, or hair that feels dry or thin. Your hair will most likely return to normal after your baby is born.  Your breasts will continue to grow and be tender. A yellow discharge may leak from your breasts called colostrum.  Your belly button may stick out.  You may feel short of breath because of your expanding uterus.  You may notice the fetus "dropping," or moving lower in your abdomen.  You may have a bloody mucus discharge. This usually occurs a few days to a week before labor begins.  Your cervix becomes thin and soft (effaced) near your due date. WHAT TO EXPECT AT YOUR PRENATAL  EXAMS  You will have prenatal exams every 2 weeks until week 36. Then, you will have weekly prenatal exams. During a routine prenatal visit:  You will be weighed to make sure you and the fetus are growing normally.  Your blood pressure is taken.  Your abdomen will be measured to track your baby's growth.  The fetal heartbeat will be listened to.  Any test results from the previous visit will be discussed.  You may have a cervical check near your due date to see if you have effaced. At around 36 weeks, your caregiver will check your cervix. At the same time, your caregiver will also perform a test on the secretions of the vaginal tissue. This test is to determine if a type of bacteria, Group B streptococcus, is present. Your caregiver will explain this further. Your caregiver may ask you:  What your birth plan is.  How you are feeling.  If you are feeling the baby move.  If you have had any abnormal symptoms, such as leaking fluid, bleeding, severe headaches, or abdominal cramping.  If you have any questions. Other tests or screenings that may be performed during your third trimester include:  Blood tests that check for low iron levels (anemia).  Fetal testing to check the health, activity level, and growth of the fetus. Testing is done if you have certain medical conditions or if there are problems during the pregnancy. FALSE LABOR You may feel small, irregular contractions that   eventually go away. These are called Braxton Hicks contractions, or false labor. Contractions may last for hours, days, or even weeks before true labor sets in. If contractions come at regular intervals, intensify, or become painful, it is best to be seen by your caregiver.  SIGNS OF LABOR   Menstrual-like cramps.  Contractions that are 5 minutes apart or less.  Contractions that start on the top of the uterus and spread down to the lower abdomen and back.  A sense of increased pelvic pressure or back  pain.  A watery or bloody mucus discharge that comes from the vagina. If you have any of these signs before the 37th week of pregnancy, call your caregiver right away. You need to go to the hospital to get checked immediately. HOME CARE INSTRUCTIONS   Avoid all smoking, herbs, alcohol, and unprescribed drugs. These chemicals affect the formation and growth of the baby.  Follow your caregiver's instructions regarding medicine use. There are medicines that are either safe or unsafe to take during pregnancy.  Exercise only as directed by your caregiver. Experiencing uterine cramps is a good sign to stop exercising.  Continue to eat regular, healthy meals.  Wear a good support bra for breast tenderness.  Do not use hot tubs, steam rooms, or saunas.  Wear your seat belt at all times when driving.  Avoid raw meat, uncooked cheese, cat litter boxes, and soil used by cats. These carry germs that can cause birth defects in the baby.  Take your prenatal vitamins.  Try taking a stool softener (if your caregiver approves) if you develop constipation. Eat more high-fiber foods, such as fresh vegetables or fruit and whole grains. Drink plenty of fluids to keep your urine clear or pale yellow.  Take warm sitz baths to soothe any pain or discomfort caused by hemorrhoids. Use hemorrhoid cream if your caregiver approves.  If you develop varicose veins, wear support hose. Elevate your feet for 15 minutes, 3-4 times a day. Limit salt in your diet.  Avoid heavy lifting, wear low heal shoes, and practice good posture.  Rest a lot with your legs elevated if you have leg cramps or low back pain.  Visit your dentist if you have not gone during your pregnancy. Use a soft toothbrush to brush your teeth and be gentle when you floss.  A sexual relationship may be continued unless your caregiver directs you otherwise.  Do not travel far distances unless it is absolutely necessary and only with the approval  of your caregiver.  Take prenatal classes to understand, practice, and ask questions about the labor and delivery.  Make a trial run to the hospital.  Pack your hospital bag.  Prepare the baby's nursery.  Continue to go to all your prenatal visits as directed by your caregiver. SEEK MEDICAL CARE IF:  You are unsure if you are in labor or if your water has broken.  You have dizziness.  You have mild pelvic cramps, pelvic pressure, or nagging pain in your abdominal area.  You have persistent nausea, vomiting, or diarrhea.  You have a bad smelling vaginal discharge.  You have pain with urination. SEEK IMMEDIATE MEDICAL CARE IF:   You have a fever.  You are leaking fluid from your vagina.  You have spotting or bleeding from your vagina.  You have severe abdominal cramping or pain.  You have rapid weight loss or gain.  You have shortness of breath with chest pain.  You notice sudden or extreme swelling   of your face, hands, ankles, feet, or legs.  You have not felt your baby move in over an hour.  You have severe headaches that do not go away with medicine.  You have vision changes. Document Released: 07/31/2001 Document Revised: 08/11/2013 Document Reviewed: 10/07/2012 ExitCare Patient Information 2015 [employer]. This information is not intended to replace advice given to you by your health care provider. Make sure you discuss any questions you have with your health care provider.  

## 2015-03-01 NOTE — Progress Notes (Signed)
Interpreter present for encounter Breastfeeding tip of the week reviewed Leukocytes: trace

## 2015-03-08 ENCOUNTER — Encounter: Payer: Self-pay | Admitting: Obstetrics & Gynecology

## 2015-03-15 ENCOUNTER — Ambulatory Visit (INDEPENDENT_AMBULATORY_CARE_PROVIDER_SITE_OTHER): Payer: Self-pay | Admitting: Obstetrics & Gynecology

## 2015-03-15 ENCOUNTER — Encounter: Payer: Self-pay | Admitting: Obstetrics & Gynecology

## 2015-03-15 VITALS — BP 107/63 | HR 82 | Temp 98.4°F | Wt 157.0 lb

## 2015-03-15 DIAGNOSIS — Z3403 Encounter for supervision of normal first pregnancy, third trimester: Secondary | ICD-10-CM

## 2015-03-15 LAB — POCT URINALYSIS DIP (DEVICE)
BILIRUBIN URINE: NEGATIVE
Glucose, UA: 100 mg/dL — AB
Hgb urine dipstick: NEGATIVE
Ketones, ur: NEGATIVE mg/dL
Nitrite: NEGATIVE
Protein, ur: 30 mg/dL — AB
SPECIFIC GRAVITY, URINE: 1.02 (ref 1.005–1.030)
Urobilinogen, UA: 1 mg/dL (ref 0.0–1.0)
pH: 7 (ref 5.0–8.0)

## 2015-03-15 NOTE — Patient Instructions (Signed)

## 2015-03-15 NOTE — Progress Notes (Signed)
Subjective:  Alicia Hutchinson is a 19 y.o. G1P0000 at [redacted]w[redacted]d being seen today for ongoing prenatal care.  Patient reports no complaints.  Contractions: Not present.  Vag. Bleeding: None. Movement: Present. Denies leaking of fluid.   The following portions of the patient's history were reviewed and updated as appropriate: allergies, current medications, past family history, past medical history, past social history, past surgical history and problem list.   Objective:   Filed Vitals:   03/15/15 1456  BP: 107/63  Pulse: 82  Temp: 98.4 F (36.9 C)  Weight: 157 lb (71.215 kg)    Fetal Status: Fetal Heart Rate (bpm): 140 Fundal Height: 33 cm Movement: Present     General:  Alert, oriented and cooperative. Patient is in no acute distress.  Skin: Skin is warm and dry. No rash noted.   Cardiovascular: Normal heart rate noted  Respiratory: Normal respiratory effort, no problems with respiration noted  Abdomen: Soft, gravid, appropriate for gestational age. Pain/Pressure: Absent     Vaginal: Vag. Bleeding: None.       Cervix: Not evaluated        Extremities: Normal range of motion.  Edema: Mild pitting, slight indentation  Mental Status: Normal mood and affect. Normal behavior. Normal judgment and thought content.   Urinalysis: Urine Protein: 1+ Urine Glucose: 1+  Assessment and Plan:  Pregnancy: G1P0000 at [redacted]w[redacted]d- language barrier.  Interpreter present for entire encounter  There are no diagnoses linked to this encounter. Preterm labor symptoms and general obstetric precautions including but not limited to vaginal bleeding, contractions, leaking of fluid and fetal movement were reviewed in detail with the patient. Please refer to After Visit Summary for other counseling recommendations.   GBS next visit  Return in about 2 weeks (around 03/29/2015).   Willodean Rosenthal, MD

## 2015-03-15 NOTE — Progress Notes (Signed)
Interpreter and husband with patient.

## 2015-03-29 ENCOUNTER — Ambulatory Visit (INDEPENDENT_AMBULATORY_CARE_PROVIDER_SITE_OTHER): Payer: Self-pay | Admitting: Obstetrics and Gynecology

## 2015-03-29 ENCOUNTER — Encounter: Payer: Self-pay | Admitting: Obstetrics and Gynecology

## 2015-03-29 VITALS — BP 122/72 | HR 103 | Temp 99.1°F | Wt 160.1 lb

## 2015-03-29 DIAGNOSIS — O99820 Streptococcus B carrier state complicating pregnancy: Secondary | ICD-10-CM

## 2015-03-29 DIAGNOSIS — Z3403 Encounter for supervision of normal first pregnancy, third trimester: Secondary | ICD-10-CM

## 2015-03-29 LAB — POCT URINALYSIS DIP (DEVICE)
BILIRUBIN URINE: NEGATIVE
GLUCOSE, UA: NEGATIVE mg/dL
Hgb urine dipstick: NEGATIVE
Leukocytes, UA: NEGATIVE
Nitrite: NEGATIVE
PH: 7.5 (ref 5.0–8.0)
Protein, ur: NEGATIVE mg/dL
Specific Gravity, Urine: 1.02 (ref 1.005–1.030)
Urobilinogen, UA: 1 mg/dL (ref 0.0–1.0)

## 2015-03-29 LAB — OB RESULTS CONSOLE GBS: GBS: POSITIVE

## 2015-03-29 NOTE — Patient Instructions (Signed)
Third Trimester of Pregnancy The third trimester is from week 29 through week 42, months 7 through 9. The third trimester is a time when the fetus is growing rapidly. At the end of the ninth month, the fetus is about 20 inches in length and weighs 6-10 pounds.  BODY CHANGES Your body goes through many changes during pregnancy. The changes vary from woman to woman.   Your weight will continue to increase. You can expect to gain 25-35 pounds (11-16 kg) by the end of the pregnancy.  You may begin to get stretch marks on your hips, abdomen, and breasts.  You may urinate more often because the fetus is moving lower into your pelvis and pressing on your bladder.  You may develop or continue to have heartburn as a result of your pregnancy.  You may develop constipation because certain hormones are causing the muscles that push waste through your intestines to slow down.  You may develop hemorrhoids or swollen, bulging veins (varicose veins).  You may have pelvic pain because of the weight gain and pregnancy hormones relaxing your joints between the bones in your pelvis. Backaches may result from overexertion of the muscles supporting your posture.  You may have changes in your hair. These can include thickening of your hair, rapid growth, and changes in texture. Some women also have hair loss during or after pregnancy, or hair that feels dry or thin. Your hair will most likely return to normal after your baby is born.  Your breasts will continue to grow and be tender. A yellow discharge may leak from your breasts called colostrum.  Your belly button may stick out.  You may feel short of breath because of your expanding uterus.  You may notice the fetus "dropping," or moving lower in your abdomen.  You may have a bloody mucus discharge. This usually occurs a few days to a week before labor begins.  Your cervix becomes thin and soft (effaced) near your due date. WHAT TO EXPECT AT YOUR PRENATAL  EXAMS  You will have prenatal exams every 2 weeks until week 36. Then, you will have weekly prenatal exams. During a routine prenatal visit:  You will be weighed to make sure you and the fetus are growing normally.  Your blood pressure is taken.  Your abdomen will be measured to track your baby's growth.  The fetal heartbeat will be listened to.  Any test results from the previous visit will be discussed.  You may have a cervical check near your due date to see if you have effaced. At around 36 weeks, your caregiver will check your cervix. At the same time, your caregiver will also perform a test on the secretions of the vaginal tissue. This test is to determine if a type of bacteria, Group B streptococcus, is present. Your caregiver will explain this further. Your caregiver may ask you:  What your birth plan is.  How you are feeling.  If you are feeling the baby move.  If you have had any abnormal symptoms, such as leaking fluid, bleeding, severe headaches, or abdominal cramping.  If you have any questions. Other tests or screenings that may be performed during your third trimester include:  Blood tests that check for low iron levels (anemia).  Fetal testing to check the health, activity level, and growth of the fetus. Testing is done if you have certain medical conditions or if there are problems during the pregnancy. FALSE LABOR You may feel small, irregular contractions that   eventually go away. These are called Braxton Hicks contractions, or false labor. Contractions may last for hours, days, or even weeks before true labor sets in. If contractions come at regular intervals, intensify, or become painful, it is best to be seen by your caregiver.  SIGNS OF LABOR   Menstrual-like cramps.  Contractions that are 5 minutes apart or less.  Contractions that start on the top of the uterus and spread down to the lower abdomen and back.  A sense of increased pelvic pressure or back  pain.  A watery or bloody mucus discharge that comes from the vagina. If you have any of these signs before the 37th week of pregnancy, call your caregiver right away. You need to go to the hospital to get checked immediately. HOME CARE INSTRUCTIONS   Avoid all smoking, herbs, alcohol, and unprescribed drugs. These chemicals affect the formation and growth of the baby.  Follow your caregiver's instructions regarding medicine use. There are medicines that are either safe or unsafe to take during pregnancy.  Exercise only as directed by your caregiver. Experiencing uterine cramps is a good sign to stop exercising.  Continue to eat regular, healthy meals.  Wear a good support bra for breast tenderness.  Do not use hot tubs, steam rooms, or saunas.  Wear your seat belt at all times when driving.  Avoid raw meat, uncooked cheese, cat litter boxes, and soil used by cats. These carry germs that can cause birth defects in the baby.  Take your prenatal vitamins.  Try taking a stool softener (if your caregiver approves) if you develop constipation. Eat more high-fiber foods, such as fresh vegetables or fruit and whole grains. Drink plenty of fluids to keep your urine clear or pale yellow.  Take warm sitz baths to soothe any pain or discomfort caused by hemorrhoids. Use hemorrhoid cream if your caregiver approves.  If you develop varicose veins, wear support hose. Elevate your feet for 15 minutes, 3-4 times a day. Limit salt in your diet.  Avoid heavy lifting, wear low heal shoes, and practice good posture.  Rest a lot with your legs elevated if you have leg cramps or low back pain.  Visit your dentist if you have not gone during your pregnancy. Use a soft toothbrush to brush your teeth and be gentle when you floss.  A sexual relationship may be continued unless your caregiver directs you otherwise.  Do not travel far distances unless it is absolutely necessary and only with the approval  of your caregiver.  Take prenatal classes to understand, practice, and ask questions about the labor and delivery.  Make a trial run to the hospital.  Pack your hospital bag.  Prepare the baby's nursery.  Continue to go to all your prenatal visits as directed by your caregiver. SEEK MEDICAL CARE IF:  You are unsure if you are in labor or if your water has broken.  You have dizziness.  You have mild pelvic cramps, pelvic pressure, or nagging pain in your abdominal area.  You have persistent nausea, vomiting, or diarrhea.  You have a bad smelling vaginal discharge.  You have pain with urination. SEEK IMMEDIATE MEDICAL CARE IF:   You have a fever.  You are leaking fluid from your vagina.  You have spotting or bleeding from your vagina.  You have severe abdominal cramping or pain.  You have rapid weight loss or gain.  You have shortness of breath with chest pain.  You notice sudden or extreme swelling   of your face, hands, ankles, feet, or legs.  You have not felt your baby move in over an hour.  You have severe headaches that do not go away with medicine.  You have vision changes. Document Released: 07/31/2001 Document Revised: 08/11/2013 Document Reviewed: 10/07/2012 ExitCare Patient Information 2015 [employer]. This information is not intended to replace advice given to you by your health care provider. Make sure you discuss any questions you have with your health care provider.  

## 2015-03-29 NOTE — Progress Notes (Signed)
Subjective:  Alicia Hutchinson is a 19 y.o. G1P0000 at [redacted]w[redacted]d being seen today for ongoing prenatal care. Interpreter here.  Patient reports no complaints. FOB reporting she has yellowish discharge. Pt states no malodor or irritation.  Questions about getting ready for baby. Contractions: Not present.  Vag. Bleeding: None. Movement: Present. Denies leaking of fluid.   The following portions of the patient's history were reviewed and updated as appropriate: allergies, current medications, past family history, past medical history, past social history, past surgical history and problem list.   Objective:   Filed Vitals:   03/29/15 1552  BP: 122/72  Pulse: 103  Temp: 99.1 F (37.3 C)  Weight: 160 lb 1.6 oz (72.621 kg)    Fetal Status: Fetal Heart Rate (bpm): 145   Movement: Present     General:  Alert, oriented and cooperative. Patient is in no acute distress.  Skin: Skin is warm and dry. No rash noted.   Cardiovascular: Normal heart rate noted  Respiratory: Normal respiratory effort, no problems with respiration noted  Abdomen: Soft, gravid, appropriate for gestational age. Pain/Pressure: Absent     Pelvic: Vag. Bleeding: None Vag D/C Character: Yellow   Cervical exam deferred        Extremities: Normal range of motion.  Edema: Trace  Mental Status: Normal mood and affect. Normal behavior. Normal judgment and thought content.   Urinalysis:      Assessment and Plan:  Pregnancy: G1P0000 at [redacted]w[redacted]d  1. Encounter for supervision of normal first pregnancy in third trimester Doing well - Culture, Grp B Strep w/Rflx Suscept - GC/Chlamydia Probe Amp WP sent  Preterm labor symptoms and general obstetric precautions including but not limited to vaginal bleeding, contractions, leaking of fluid and fetal movement were reviewed in detail with the patient. Please refer to After Visit Summary for other counseling recommendations.  Return in about 1 week (around 04/05/2015).   Danae Orleans, CNM

## 2015-03-30 LAB — GC/CHLAMYDIA PROBE AMP
CT Probe RNA: NEGATIVE
GC Probe RNA: NEGATIVE

## 2015-03-30 LAB — CULTURE, BETA STREP (GROUP B ONLY)

## 2015-04-04 DIAGNOSIS — O99820 Streptococcus B carrier state complicating pregnancy: Secondary | ICD-10-CM | POA: Insufficient documentation

## 2015-04-05 ENCOUNTER — Ambulatory Visit (INDEPENDENT_AMBULATORY_CARE_PROVIDER_SITE_OTHER): Payer: Self-pay | Admitting: Obstetrics & Gynecology

## 2015-04-05 VITALS — BP 113/64 | HR 96 | Temp 98.1°F | Wt 163.1 lb

## 2015-04-05 DIAGNOSIS — Z789 Other specified health status: Secondary | ICD-10-CM

## 2015-04-05 DIAGNOSIS — Z3403 Encounter for supervision of normal first pregnancy, third trimester: Secondary | ICD-10-CM

## 2015-04-05 DIAGNOSIS — O99820 Streptococcus B carrier state complicating pregnancy: Secondary | ICD-10-CM

## 2015-04-05 LAB — POCT URINALYSIS DIP (DEVICE)
BILIRUBIN URINE: NEGATIVE
Glucose, UA: NEGATIVE mg/dL
KETONES UR: NEGATIVE mg/dL
LEUKOCYTES UA: NEGATIVE
Nitrite: NEGATIVE
PH: 7 (ref 5.0–8.0)
Protein, ur: NEGATIVE mg/dL
SPECIFIC GRAVITY, URINE: 1.02 (ref 1.005–1.030)
Urobilinogen, UA: 0.2 mg/dL (ref 0.0–1.0)

## 2015-04-05 NOTE — Patient Instructions (Addendum)
Return to clinic for any obstetric concerns or go to [facility] for evaluation Group B Streptococcus Infection During Pregnancy Group B streptococcus (GBS) is a type of bacteria often found in healthy women. GBS is not the same as the bacteria that causes strep throat. You may have GBS in your vagina, rectum, or bladder. GBS does not spread through sexual contact, but it can be passed to a baby during childbirth. This can be dangerous for your baby. It is not dangerous to you and usually does not cause any symptoms. Your health care provider may test you for GBS when your pregnancy is between 35 and 37 weeks. GBS is dangerous only during birth, so there is no need to test for it earlier. It is possible to have GBS during pregnancy and never pass it to your baby. If your test results are positive for GBS, your health care provider may recommend giving you antibiotic medicine during delivery to make sure your baby stays healthy. RISK FACTORS You are more likely to pass GBS to your baby if:   Your water breaks (ruptured membrane) or you go into labor before 37 weeks.  Your water breaks 18 hours before you deliver.  You passed GBS during a previous pregnancy.  You have a urinary tract infection caused by GBS any time during pregnancy.  You have a fever during labor. SYMPTOMS Most women who have GBS do not have any symptoms. If you have a urinary tract infection caused by GBS, you might have frequent or painful urination and fever. Babies who get GBS usually show symptoms within 7 days of birth. Symptoms may include:   Breathing problems.  Heart and blood pressure problems.  Digestive and kidney problems. DIAGNOSIS Routine screening for GBS is recommended for all pregnant women. A health care provider takes a sample of the fluid in your vagina and rectum with a swab. It is then sent to a lab to be checked for GBS. A sample of your urine may also be checked for the bacteria.  TREATMENT If you test  positive for GBS, you may need treatment with an antibiotic medicine during labor. As soon as you go into labor, or as soon as your membranes rupture, you will get the antibiotic medicine through an IV access. You will continue to get the medicine until after you give birth. You do not need antibiotic medicine if you are having a cesarean delivery.If your baby shows signs or symptoms of GBS after birth, your baby can also be treated with an antibiotic medicine. HOME CARE INSTRUCTIONS   Take all antibiotic medicine as prescribed by your health care provider. Only take medicine as directed.   Continue with prenatal visits and care.   Keep all follow-up appointments.  SEEK MEDICAL CARE IF:   You have pain when you urinate.   You have to urinate frequently.   You have a fever.  SEEK IMMEDIATE MEDICAL CARE IF:   Your membranes rupture.  You go into labor. Document Released: 11/13/2007 Document Revised: 08/11/2013 Document Reviewed: 05/29/2013 [employer] Patient Information 2015 [employer]. This information is not intended to replace advice given to you by your health care provider. Make sure you discuss any questions you have with your health care provider.  

## 2015-04-05 NOTE — Progress Notes (Addendum)
Subjective:  Alicia Hutchinson is a 19 y.o. G1P0000 at [redacted]w[redacted]d being seen today for ongoing prenatal care. Mandarin [place] interpreter present.  Patient reports occasional contractions.  Contractions: Irritability.  Vag. Bleeding: None. Movement: Present. Denies leaking of fluid.   The following portions of the patient's history were reviewed and updated as appropriate: allergies, current medications, past family history, past medical history, past social history, past surgical history and problem list.   Objective:   Filed Vitals:   04/05/15 1453  BP: 113/64  Pulse: 96  Temp: 98.1 F (36.7 C)  Weight: 163 lb 1.6 oz (73.982 kg)    Fetal Status: Fetal Heart Rate (bpm): 137 Fundal Height: 37 cm Movement: Present  Presentation: Vertex  General:  Alert, oriented and cooperative. Patient is in no acute distress.  Skin: Skin is warm and dry. No rash noted.   Cardiovascular: Normal heart rate noted  Respiratory: Normal respiratory effort, no problems with respiration noted  Abdomen: Soft, gravid, appropriate for gestational age. Pain/Pressure: Present     Pelvic: Vag. Bleeding: None Vag D/C Character: Yellow   Cervical exam performed Dilation: 1 Effacement (%): 50 Station: -2  Extremities: Normal range of motion.  Edema: Trace  Mental Status: Normal mood and affect. Normal behavior. Normal judgment and thought content.   Urinalysis: Urine Protein: Negative (Simultaneous filing. User may not have seen previous data.) Urine Glucose: Negative (Simultaneous filing. User may not have seen previous data.)  Assessment and Plan:  Pregnancy: G1P0000 at [redacted]w[redacted]d  Supervision of normal first pregnancy, antepartum, third trimester Informed of GBS positive status, need to treat in labor Term labor symptoms and general obstetric precautions including but not limited to vaginal bleeding, contractions, leaking of fluid and fetal movement were reviewed in detail with the patient. Please refer to After Visit Summary  for other counseling recommendations.  Return in about 1 week (around 04/12/2015) for OB Visit.   Tereso Newcomer, MD

## 2015-04-05 NOTE — Progress Notes (Signed)
Rubin Payor used for interpreter Reviewed tip of week with patient

## 2015-04-12 ENCOUNTER — Ambulatory Visit (INDEPENDENT_AMBULATORY_CARE_PROVIDER_SITE_OTHER): Payer: Self-pay | Admitting: Family Medicine

## 2015-04-12 VITALS — BP 120/67 | HR 85 | Temp 97.0°F | Wt 164.0 lb

## 2015-04-12 DIAGNOSIS — Z789 Other specified health status: Secondary | ICD-10-CM

## 2015-04-12 DIAGNOSIS — O99820 Streptococcus B carrier state complicating pregnancy: Secondary | ICD-10-CM

## 2015-04-12 DIAGNOSIS — Z3403 Encounter for supervision of normal first pregnancy, third trimester: Secondary | ICD-10-CM

## 2015-04-12 LAB — POCT URINALYSIS DIP (DEVICE)
BILIRUBIN URINE: NEGATIVE
Glucose, UA: NEGATIVE mg/dL
HGB URINE DIPSTICK: NEGATIVE
Ketones, ur: NEGATIVE mg/dL
Nitrite: NEGATIVE
PH: 7.5 (ref 5.0–8.0)
PROTEIN: NEGATIVE mg/dL
SPECIFIC GRAVITY, URINE: 1.015 (ref 1.005–1.030)
Urobilinogen, UA: 0.2 mg/dL (ref 0.0–1.0)

## 2015-04-12 NOTE — Patient Instructions (Signed)
Breastfeeding Deciding to breastfeed is one of the best choices you can make for you and your baby. A change in hormones during pregnancy causes your breast tissue to grow and increases the number and size of your milk ducts. These hormones also allow proteins, sugars, and fats from your blood supply to make breast milk in your milk-producing glands. Hormones prevent breast milk from being released before your baby is born as well as prompt milk flow after birth. Once breastfeeding has begun, thoughts of your baby, as well as his or her sucking or crying, can stimulate the release of milk from your milk-producing glands.  BENEFITS OF BREASTFEEDING For Your Baby  Your first milk (colostrum) helps your baby's digestive system function better.   There are antibodies in your milk that help your baby fight off infections.   Your baby has a lower incidence of asthma, allergies, and sudden infant death syndrome.   The nutrients in breast milk are better for your baby than infant formulas and are designed uniquely for your baby's needs.   Breast milk improves your baby's brain development.   Your baby is less likely to develop other conditions, such as childhood obesity, asthma, or type 2 diabetes mellitus.  For You   Breastfeeding helps to create a very special bond between you and your baby.   Breastfeeding is convenient. Breast milk is always available at the correct temperature and costs nothing.   Breastfeeding helps to burn calories and helps you lose the weight gained during pregnancy.   Breastfeeding makes your uterus contract to its prepregnancy size faster and slows bleeding (lochia) after you give birth.   Breastfeeding helps to lower your risk of developing type 2 diabetes mellitus, osteoporosis, and breast or ovarian cancer later in life. SIGNS THAT YOUR BABY IS HUNGRY Early Signs of Hunger  Increased alertness or activity.  Stretching.  Movement of the head from  side to side.  Movement of the head and opening of the mouth when the corner of the mouth or cheek is stroked (rooting).  Increased sucking sounds, smacking lips, cooing, sighing, or squeaking.  Hand-to-mouth movements.  Increased sucking of fingers or hands. Late Signs of Hunger  Fussing.  Intermittent crying. Extreme Signs of Hunger Signs of extreme hunger will require calming and consoling before your baby will be able to breastfeed successfully. Do not wait for the following signs of extreme hunger to occur before you initiate breastfeeding:   Restlessness.  A loud, strong cry.   Screaming. BREASTFEEDING BASICS Breastfeeding Initiation  Find a comfortable place to sit or lie down, with your neck and back well supported.  Place a pillow or rolled up blanket under your baby to bring him or her to the level of your breast (if you are seated). Nursing pillows are specially designed to help support your arms and your baby while you breastfeed.  Make sure that your baby's abdomen is facing your abdomen.   Gently massage your breast. With your fingertips, massage from your chest wall toward your nipple in a circular motion. This encourages milk flow. You may need to continue this action during the feeding if your milk flows slowly.  Support your breast with 4 fingers underneath and your thumb above your nipple. Make sure your fingers are well away from your nipple and your baby's mouth.   Stroke your baby's lips gently with your finger or nipple.   When your baby's mouth is open wide enough, quickly bring your baby to your   breast, placing your entire nipple and as much of the colored area around your nipple (areola) as possible into your baby's mouth.   More areola should be visible above your baby's upper lip than below the lower lip.   Your baby's tongue should be between his or her lower gum and your breast.   Ensure that your baby's mouth is correctly positioned  around your nipple (latched). Your baby's lips should create a seal on your breast and be turned out (everted).  It is common for your baby to suck about 2-3 minutes in order to start the flow of breast milk. Latching Teaching your baby how to latch on to your breast properly is very important. An improper latch can cause nipple pain and decreased milk supply for you and poor weight gain in your baby. Also, if your baby is not latched onto your nipple properly, [place] or she may swallow some air during feeding. This can make your baby fussy. Burping your baby when you switch breasts during the feeding can help to get rid of the air. However, teaching your baby to latch on properly is still the best way to prevent fussiness from swallowing air while breastfeeding. Signs that your baby has successfully latched on to your nipple:    Silent tugging or silent sucking, without causing you pain.   Swallowing heard between every 3-4 sucks.    Muscle movement above and in front of his or her ears while sucking.  Signs that your baby has not successfully latched on to nipple:   Sucking sounds or smacking sounds from your baby while breastfeeding.  Nipple pain. If you think your baby has not latched on correctly, slip your finger into the corner of your baby's mouth to break the suction and place it between your baby's gums. Attempt breastfeeding initiation again. Signs of Successful Breastfeeding Signs from your baby:   A gradual decrease in the number of sucks or complete cessation of sucking.   Falling asleep.   Relaxation of his or her body.   Retention of a small amount of milk in his or her mouth.   Letting go of your breast by himself or herself. Signs from you:  Breasts that have increased in firmness, weight, and size 1-3 hours after feeding.   Breasts that are softer immediately after breastfeeding.  Increased milk volume, as well as a change in milk consistency and color by  the fifth day of breastfeeding.   Nipples that are not sore, cracked, or bleeding. Signs That Your Baby is Getting Enough Milk  Wetting at least 3 diapers in a 24-hour period. The urine should be clear and pale yellow by age 5 days.  At least 3 stools in a 24-hour period by age 5 days. The stool should be soft and yellow.  At least 3 stools in a 24-hour period by age 7 days. The stool should be seedy and yellow.  No loss of weight greater than 10% of birth weight during the first 3 days of age.  Average weight gain of 4-7 ounces (113-198 g) per week after age 4 days.  Consistent daily weight gain by age 5 days, without weight loss after the age of 2 weeks. After a feeding, your baby may spit up a small amount. This is common. BREASTFEEDING FREQUENCY AND DURATION Frequent feeding will help you make more milk and can prevent sore nipples and breast engorgement. Breastfeed when you feel the need to reduce the fullness of your breasts   or when your baby shows signs of hunger. This is called "breastfeeding on demand." Avoid introducing a pacifier to your baby while you are working to establish breastfeeding (the first 4-6 weeks after your baby is born). After this time you may choose to use a pacifier. Research has shown that pacifier use during the first year of a baby's life decreases the risk of sudden infant death syndrome (SIDS). Allow your baby to feed on each breast as long as [place] or she wants. Breastfeed until your baby is finished feeding. When your baby unlatches or falls asleep while feeding from the first breast, offer the second breast. Because newborns are often sleepy in the first few weeks of life, you may need to awaken your baby to get him or her to feed. Breastfeeding times will vary from baby to baby. However, the following rules can serve as a guide to help you ensure that your baby is properly fed:  Newborns (babies 4 weeks of age or younger) may breastfeed every 1-3  hours.  Newborns should not go longer than 3 hours during the day or 5 hours during the night without breastfeeding.  You should breastfeed your baby a minimum of 8 times in a 24-hour period until you begin to introduce solid foods to your baby at around 6 months of age. BREAST MILK PUMPING Pumping and storing breast milk allows you to ensure that your baby is exclusively fed your breast milk, even at times when you are unable to breastfeed. This is especially important if you are going back to work while you are still breastfeeding or when you are not able to be present during feedings. Your lactation consultant can give you guidelines on how long it is safe to store breast milk.  A breast pump is a machine that allows you to pump milk from your breast into a sterile bottle. The pumped breast milk can then be stored in a refrigerator or freezer. Some breast pumps are operated by hand, while others use electricity. Ask your lactation consultant which type will work best for you. Breast pumps can be purchased, but some hospitals and breastfeeding support groups lease breast pumps on a monthly basis. A lactation consultant can teach you how to hand express breast milk, if you prefer not to use a pump.  CARING FOR YOUR BREASTS WHILE YOU BREASTFEED Nipples can become dry, cracked, and sore while breastfeeding. The following recommendations can help keep your breasts moisturized and healthy:  Avoid using soap on your nipples.   Wear a supportive bra. Although not required, special nursing bras and tank tops are designed to allow access to your breasts for breastfeeding without taking off your entire bra or top. Avoid wearing underwire-style bras or extremely tight bras.  Air dry your nipples for 3-4minutes after each feeding.   Use only cotton bra pads to absorb leaked breast milk. Leaking of breast milk between feedings is normal.   Use lanolin on your nipples after breastfeeding. Lanolin helps to  maintain your skin's normal moisture barrier. If you use pure lanolin, you do not need to wash it off before feeding your baby again. Pure lanolin is not toxic to your baby. You may also hand express a few drops of breast milk and gently massage that milk into your nipples and allow the milk to air dry. In the first few weeks after giving birth, some women experience extremely full breasts (engorgement). Engorgement can make your breasts feel heavy, warm, and tender to the   touch. Engorgement peaks within 3-5 days after you give birth. The following recommendations can help ease engorgement:  Completely empty your breasts while breastfeeding or pumping. You may want to start by applying warm, moist heat (in the shower or with warm water-soaked hand towels) just before feeding or pumping. This increases circulation and helps the milk flow. If your baby does not completely empty your breasts while breastfeeding, pump any extra milk after [place] or she is finished.  Wear a snug bra (nursing or regular) or tank top for 1-2 days to signal your body to slightly decrease milk production.  Apply ice packs to your breasts, unless this is too uncomfortable for you.  Make sure that your baby is latched on and positioned properly while breastfeeding. If engorgement persists after 48 hours of following these recommendations, contact your health care provider or a Advertising copywriter. OVERALL HEALTH CARE RECOMMENDATIONS WHILE BREASTFEEDING  Eat healthy foods. Alternate between meals and snacks, eating 3 of each per day. Because what you eat affects your breast milk, some of the foods may make your baby more irritable than usual. Avoid eating these foods if you are sure that they are negatively affecting your baby.  Drink milk, fruit juice, and water to satisfy your thirst (about 10 glasses a day).   Rest often, relax, and continue to take your prenatal vitamins to prevent fatigue, stress, and anemia.  Continue  breast self-awareness checks.  Avoid chewing and smoking tobacco.  Avoid alcohol and drug use. Some medicines that may be harmful to your baby can pass through breast milk. It is important to ask your health care provider before taking any medicine, including all over-the-counter and prescription medicine as well as vitamin and herbal supplements. It is possible to become pregnant while breastfeeding. If birth control is desired, ask your health care provider about options that will be safe for your baby. SEEK MEDICAL CARE IF:   You feel like you want to stop breastfeeding or have become frustrated with breastfeeding.  You have painful breasts or nipples.  Your nipples are cracked or bleeding.  Your breasts are red, tender, or warm.  You have a swollen area on either breast.  You have a fever or chills.  You have nausea or vomiting.  You have drainage other than breast milk from your nipples.  Your breasts do not become full before feedings by the fifth day after you give birth.  You feel sad and depressed.  Your baby is too sleepy to eat well.  Your baby is having trouble sleeping.   Your baby is wetting less than 3 diapers in a 24-hour period.  Your baby has less than 3 stools in a 24-hour period.  Your baby's skin or the white part of his or her eyes becomes yellow.   Your baby is not gaining weight by 60 days of age. SEEK IMMEDIATE MEDICAL CARE IF:   Your baby is overly tired (lethargic) and does not want to wake up and feed.  Your baby develops an unexplained fever. Document Released: 08/06/2005 Document Revised: 08/11/2013 Document Reviewed: 01/28/2013 [facility] Patient Information 2015 [place], [place]. This information is not intended to replace advice given to you by your health care provider. Make sure you discuss any questions you have with your health care provider. Third Trimester of Pregnancy The third trimester is from week 29 through week 42, months 7  through 9. The third trimester is a time when the fetus is growing rapidly. At the end of  the ninth month, the fetus is about 20 inches in length and weighs 6-10 pounds.  BODY CHANGES Your body goes through many changes during pregnancy. The changes vary from woman to woman.   Your weight will continue to increase. You can expect to gain 25-35 pounds (11-16 kg) by the end of the pregnancy.  You may begin to get stretch marks on your hips, abdomen, and breasts.  You may urinate more often because the fetus is moving lower into your pelvis and pressing on your bladder.  You may develop or continue to have heartburn as a result of your pregnancy.  You may develop constipation because certain hormones are causing the muscles that push waste through your intestines to slow down.  You may develop hemorrhoids or swollen, bulging veins (varicose veins).  You may have pelvic pain because of the weight gain and pregnancy hormones relaxing your joints between the bones in your pelvis. Backaches may result from overexertion of the muscles supporting your posture.  You may have changes in your hair. These can include thickening of your hair, rapid growth, and changes in texture. Some women also have hair loss during or after pregnancy, or hair that feels dry or thin. Your hair will most likely return to normal after your baby is born.  Your breasts will continue to grow and be tender. A yellow discharge may leak from your breasts called colostrum.  Your belly button may stick out.  You may feel short of breath because of your expanding uterus.  You may notice the fetus "dropping," or moving lower in your abdomen.  You may have a bloody mucus discharge. This usually occurs a few days to a week before labor begins.  Your cervix becomes thin and soft (effaced) near your due date. WHAT TO EXPECT AT YOUR PRENATAL EXAMS  You will have prenatal exams every 2 weeks until week 36. Then, you will have  weekly prenatal exams. During a routine prenatal visit:  You will be weighed to make sure you and the fetus are growing normally.  Your blood pressure is taken.  Your abdomen will be measured to track your baby's growth.  The fetal heartbeat will be listened to.  Any test results from the previous visit will be discussed.  You may have a cervical check near your due date to see if you have effaced. At around 36 weeks, your caregiver will check your cervix. At the same time, your caregiver will also perform a test on the secretions of the vaginal tissue. This test is to determine if a type of bacteria, Group B streptococcus, is present. Your caregiver will explain this further. Your caregiver may ask you:  What your birth plan is.  How you are feeling.  If you are feeling the baby move.  If you have had any abnormal symptoms, such as leaking fluid, bleeding, severe headaches, or abdominal cramping.  If you have any questions. Other tests or screenings that may be performed during your third trimester include:  Blood tests that check for low iron levels (anemia).  Fetal testing to check the health, activity level, and growth of the fetus. Testing is done if you have certain medical conditions or if there are problems during the pregnancy. FALSE LABOR You may feel small, irregular contractions that eventually go away. These are called Braxton Hicks contractions, or false labor. Contractions may last for hours, days, or even weeks before true labor sets in. If contractions come at regular intervals, intensify, or  become painful, it is best to be seen by your caregiver.  SIGNS OF LABOR   Menstrual-like cramps.  Contractions that are 5 minutes apart or less.  Contractions that start on the top of the uterus and spread down to the lower abdomen and back.  A sense of increased pelvic pressure or back pain.  A watery or bloody mucus discharge that comes from the vagina. If you have  any of these signs before the 37th week of pregnancy, call your caregiver right away. You need to go to the hospital to get checked immediately. HOME CARE INSTRUCTIONS   Avoid all smoking, herbs, alcohol, and unprescribed drugs. These chemicals affect the formation and growth of the baby.  Follow your caregiver's instructions regarding medicine use. There are medicines that are either safe or unsafe to take during pregnancy.  Exercise only as directed by your caregiver. Experiencing uterine cramps is a good sign to stop exercising.  Continue to eat regular, healthy meals.  Wear a good support bra for breast tenderness.  Do not use hot tubs, steam rooms, or saunas.  Wear your seat belt at all times when driving.  Avoid raw meat, uncooked cheese, cat litter boxes, and soil used by cats. These carry germs that can cause birth defects in the baby.  Take your prenatal vitamins.  Try taking a stool softener (if your caregiver approves) if you develop constipation. Eat more high-fiber foods, such as fresh vegetables or fruit and whole grains. Drink plenty of fluids to keep your urine clear or pale yellow.  Take warm sitz baths to soothe any pain or discomfort caused by hemorrhoids. Use hemorrhoid cream if your caregiver approves.  If you develop varicose veins, wear support hose. Elevate your feet for 15 minutes, 3-4 times a day. Limit salt in your diet.  Avoid heavy lifting, wear low heal shoes, and practice good posture.  Rest a lot with your legs elevated if you have leg cramps or low back pain.  Visit your dentist if you have not gone during your pregnancy. Use a soft toothbrush to brush your teeth and be gentle when you floss.  A sexual relationship may be continued unless your caregiver directs you otherwise.  Do not travel far distances unless it is absolutely necessary and only with the approval of your caregiver.  Take prenatal classes to understand, practice, and ask questions  about the labor and delivery.  Make a trial run to the hospital.  Pack your hospital bag.  Prepare the baby's nursery.  Continue to go to all your prenatal visits as directed by your caregiver. SEEK MEDICAL CARE IF:  You are unsure if you are in labor or if your water has broken.  You have dizziness.  You have mild pelvic cramps, pelvic pressure, or nagging pain in your abdominal area.  You have persistent nausea, vomiting, or diarrhea.  You have a bad smelling vaginal discharge.  You have pain with urination. SEEK IMMEDIATE MEDICAL CARE IF:   You have a fever.  You are leaking fluid from your vagina.  You have spotting or bleeding from your vagina.  You have severe abdominal cramping or pain.  You have rapid weight loss or gain.  You have shortness of breath with chest pain.  You notice sudden or extreme swelling of your face, hands, ankles, feet, or legs.  You have not felt your baby move in over an hour.  You have severe headaches that do not go away with medicine.  You  Document Released: 07/31/2001 Document Revised: 08/11/2013 Document Reviewed: 10/07/2012 [employer] Patient Information 2015 [employer]. This information is not intended to replace advice given to you by your health care provider. Make sure you discuss any questions you have with your health care provider.     

## 2015-04-12 NOTE — Progress Notes (Signed)
Subjective:  Alicia Hutchinson is a 19 y.o. G1P0000 at [redacted]w[redacted]d being seen today for ongoing prenatal care.  Patient reports no complaints.  Contractions: Irritability.  Vag. Bleeding: None. Movement: Present. Denies leaking of fluid.   feels baby moving regularly  The following portions of the patient's history were reviewed and updated as appropriate: allergies, current medications, past family history, past medical history, past social history, past surgical history and problem list.   Objective:   Filed Vitals:   04/12/15 1506  BP: 120/67  Pulse: 85  Temp: 97 F (36.1 C)  Weight: 164 lb (74.39 kg)    Fetal Status: Fetal Heart Rate (bpm): 135   Movement: Present     General:  Alert, oriented and cooperative. Patient is in no acute distress.  Skin: Skin is warm and dry. No rash noted.   Cardiovascular: Normal heart rate noted  Respiratory: Normal respiratory effort, no problems with respiration noted  Abdomen: Soft, gravid, appropriate for gestational age. Pain/Pressure: Present     Pelvic: Vag. Bleeding: None     Cervical exam deferred        Extremities: Normal range of motion.  Edema: None  Mental Status: Normal mood and affect. Normal behavior. Normal judgment and thought content.   Urinalysis:      Assessment and Plan:  Pregnancy: G1P0000 at [redacted]w[redacted]d  1. Supervision of normal first pregnancy, antepartum, third trimester -updated box -Discussed contraception options in detail  2. Streptococcus b carrier state affecting pregnancy -PCN in labor  3. Language barrier; speaks Mandarin [place] Used interpreter  Term labor symptoms and general obstetric precautions including but not limited to vaginal bleeding, contractions, leaking of fluid and fetal movement were reviewed in detail with the patient. Please refer to After Visit Summary for other counseling recommendations.   Return in about 1 week (around 04/19/2015) for Routine prenatal care.   Federico Flake, MD

## 2015-04-19 ENCOUNTER — Inpatient Hospital Stay (HOSPITAL_COMMUNITY)
Admission: AD | Admit: 2015-04-19 | Discharge: 2015-04-19 | Disposition: A | Discharge status: Home / Self Care | Payer: Self-pay | Source: Ambulatory Visit | Attending: Obstetrics & Gynecology | Admitting: Obstetrics & Gynecology

## 2015-04-19 ENCOUNTER — Encounter: Payer: Self-pay | Admitting: Obstetrics and Gynecology

## 2015-04-19 ENCOUNTER — Ambulatory Visit (INDEPENDENT_AMBULATORY_CARE_PROVIDER_SITE_OTHER): Payer: Self-pay | Admitting: Obstetrics and Gynecology

## 2015-04-19 ENCOUNTER — Inpatient Hospital Stay (HOSPITAL_COMMUNITY)
Admission: AD | Admit: 2015-04-19 | Discharge: 2015-04-22 | DRG: 775 | Disposition: A | Discharge status: Home / Self Care | Payer: Self-pay | Source: Ambulatory Visit | Attending: Obstetrics & Gynecology | Admitting: Obstetrics & Gynecology

## 2015-04-19 ENCOUNTER — Encounter (HOSPITAL_COMMUNITY): Payer: Self-pay | Admitting: *Deleted

## 2015-04-19 VITALS — BP 118/74 | HR 94 | Temp 98.6°F | Wt 166.7 lb

## 2015-04-19 DIAGNOSIS — Z3403 Encounter for supervision of normal first pregnancy, third trimester: Secondary | ICD-10-CM

## 2015-04-19 DIAGNOSIS — O99820 Streptococcus B carrier state complicating pregnancy: Secondary | ICD-10-CM

## 2015-04-19 DIAGNOSIS — Z3A39 39 weeks gestation of pregnancy: Secondary | ICD-10-CM | POA: Diagnosis present

## 2015-04-19 DIAGNOSIS — IMO0001 Reserved for inherently not codable concepts without codable children: Secondary | ICD-10-CM

## 2015-04-19 DIAGNOSIS — Z789 Other specified health status: Secondary | ICD-10-CM

## 2015-04-19 DIAGNOSIS — O26893 Other specified pregnancy related conditions, third trimester: Secondary | ICD-10-CM | POA: Insufficient documentation

## 2015-04-19 DIAGNOSIS — O99824 Streptococcus B carrier state complicating childbirth: Secondary | ICD-10-CM | POA: Diagnosis present

## 2015-04-19 LAB — AMNISURE RUPTURE OF MEMBRANE (ROM) NOT AT ARMC: AMNISURE: NEGATIVE

## 2015-04-19 MED ORDER — ZOLPIDEM TARTRATE ER 6.25 MG PO TBCR: 6.2500 mg | EXTENDED_RELEASE_TABLET | Freq: Every evening | ORAL | Status: DC | PRN

## 2015-04-19 MED ORDER — OXYCODONE-ACETAMINOPHEN 5-325 MG PO TABS: 1.0000 | ORAL_TABLET | Freq: Four times a day (QID) | ORAL | Status: DC | PRN

## 2015-04-19 NOTE — Discharge Instructions (Signed)
Premature Rupture and Preterm Premature Rupture of Membranes °A sac made up of membranes surrounds your baby in the womb (uterus). When this sac breaks before contractions or labor starts, it is called premature rupture of membranes (PROM). Rupture of membranes is also known as your water breaking. If this happens before 37 weeks, it is called preterm premature rupture of membranes (PPROM). PPROM is serious. It needs medical care right away. °CAUSES  °PROM may be caused by the membranes getting weak. This happens at the end of pregnancy. PPROM is often due to an infection, but can be caused by a number of other things.  °SIGNS OF PROM OR PPROM °· A sudden gush of fluid from the vagina. °· A slow leak of fluid from the vagina. °· Your underwear stay wet. °WHAT TO DO IF YOU THINK YOUR WATER BROKE °Call your doctor right away. You will need to go to the hospital to get checked right away. °WHAT HAPPENS IF YOU ARE TOLD YOU HAVE PROM OR PPROM? °You will have tests done at the hospital. If you have PROM, you may be given medicine to start labor (induced). This may happen if you are not having contractions within 24 hours of your water breaking. If you have PPROM and are not having contractions, you may be given medicine to start labor. It will depend on how far along you are in your pregnancy. °If you have PPROM, you: °· And your baby will be watched closely for signs of infection or other problems. °· May be given an antibiotic medicine. This can stop an infection from starting. °· May be given a steroid medicine. This can help the lungs to develop faster. °· May be given a medicine to stop early labor (preterm labor). °· May be told to stay in bed except to use the restroom (bed rest). °· May be given medicine to start labor. This can happen if there are problems with you or the baby. °Your treatment will depend on many factors. °Document Released: 11/02/2008 Document Revised: 04/08/2013 Document Reviewed:  11/25/2012 °ExitCare® Patient Information ©2015 [employer]. This information is not intended to replace advice given to you by your health care provider. Make sure you discuss any questions you have with your health care provider. ° °

## 2015-04-19 NOTE — MAU Provider Note (Signed)
I reviewed patient's FHR tracing with fellow Dr. Lyndel Safe. Given reassuring tracing, we will not admit patient at this time. Discharge with percocet and Ambien. She was given preterm labor precautions by her nurse through an interpreter.   Tarri Abernethy, MD PGY-1 [facility]   OB fellow attestation: I have seen and examined this patient; I agree with above documentation in the resident's note.   Federico Flake, MD 7:44 PM

## 2015-04-19 NOTE — Progress Notes (Signed)
Interpreter Alicia Hutchinson Pt reports having contractions every 10 mins since midnight

## 2015-04-19 NOTE — MAU Note (Signed)
C/o ucs since yesterday afternoon;  ?SROM and some vaginal spotting;

## 2015-04-19 NOTE — Progress Notes (Signed)
Subjective:  Alicia Hutchinson is a 19 y.o. G1P0000 at [redacted]w[redacted]d being seen today for ongoing prenatal care.  Patient reports regular contractions every 10 minutes since midnight. Her boyfriend told her that she wasn't suppose to go to the hospital until her water broke..  Contractions: Irregular.  Vag. Bleeding: Bloody Show. Movement: Present. Denies leaking of fluid.   The following portions of the patient's history were reviewed and updated as appropriate: allergies, current medications, past family history, past medical history, past social history, past surgical history and problem list.   Objective:   Filed Vitals:   04/19/15 1452  BP: 118/74  Pulse: 94  Temp: 98.6 F (37 C)  Weight: 166 lb 11.2 oz (75.615 kg)    Fetal Status: Fetal Heart Rate (bpm): 155   Movement: Present     General:  Alert, oriented and cooperative. Patient is in no acute distress.  Skin: Skin is warm and dry. No rash noted.   Cardiovascular: Normal heart rate noted  Respiratory: Normal respiratory effort, no problems with respiration noted  Abdomen: Soft, gravid, appropriate for gestational age. Pain/Pressure: Present     Pelvic: Vag. Bleeding: Bloody Show Vag D/C Character: White   Cervical exam performed      3/70/-2  Extremities: Normal range of motion.     Mental Status: Normal mood and affect. Normal behavior. Normal judgment and thought content.   Urinalysis:      Assessment and Plan:  Pregnancy: G1P0000 at [redacted]w[redacted]d  1. Supervision of normal first pregnancy, antepartum, third trimester Patient appears to be in early labor Patient sent to MAU for repeat cervical exam to determine if she needs to be admitted Labor precautions reviewed and it was stressed that rupture of membrane is not a requirement for admission  2. 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 Language barrier; speaks Mandarin [place] Interpreter present  Term labor symptoms and general obstetric  precautions including but not limited to vaginal bleeding, contractions, leaking of fluid and fetal movement were reviewed in detail with the patient. Please refer to After Visit Summary for other counseling recommendations.  Return in about 1 week (around 04/26/2015). if not delivered   Catalina Antigua, MD

## 2015-04-19 NOTE — MAU Note (Signed)
Pt reports ?ROM at 2310

## 2015-04-19 NOTE — MAU Note (Signed)
Pt contracting, sent up from clinic for further eval.

## 2015-04-20 ENCOUNTER — Inpatient Hospital Stay (HOSPITAL_COMMUNITY): Payer: Self-pay | Admitting: Anesthesiology

## 2015-04-20 ENCOUNTER — Encounter (HOSPITAL_COMMUNITY): Payer: Self-pay

## 2015-04-20 DIAGNOSIS — IMO0001 Reserved for inherently not codable concepts without codable children: Secondary | ICD-10-CM

## 2015-04-20 DIAGNOSIS — Z3A39 39 weeks gestation of pregnancy: Secondary | ICD-10-CM

## 2015-04-20 DIAGNOSIS — O99824 Streptococcus B carrier state complicating childbirth: Secondary | ICD-10-CM

## 2015-04-20 LAB — CBC
HEMATOCRIT: 41.8 % (ref 36.0–46.0)
HEMOGLOBIN: 14.6 g/dL (ref 12.0–15.0)
MCH: 32.9 pg (ref 26.0–34.0)
MCHC: 34.9 g/dL (ref 30.0–36.0)
MCV: 94.1 fL (ref 78.0–100.0)
Platelets: 197 10*3/uL (ref 150–400)
RBC: 4.44 MIL/uL (ref 3.87–5.11)
RDW: 13.2 % (ref 11.5–15.5)
WBC: 17.2 10*3/uL — ABNORMAL HIGH (ref 4.0–10.5)

## 2015-04-20 LAB — TYPE AND SCREEN
ABO/RH(D): B POS
Antibody Screen: NEGATIVE

## 2015-04-20 LAB — RPR: RPR: NONREACTIVE

## 2015-04-20 LAB — ABO/RH: ABO/RH(D): B POS

## 2015-04-20 MED ORDER — LACTATED RINGERS IV SOLN
INTRAVENOUS | Status: DC
  Administered 2015-04-20 (×2): via INTRAVENOUS

## 2015-04-20 MED ORDER — OXYTOCIN 40 UNITS IN LACTATED RINGERS INFUSION - SIMPLE MED
62.5000 mL/h | INTRAVENOUS | Status: DC
  Filled 2015-04-20: qty 1000

## 2015-04-20 MED ORDER — OXYTOCIN 40 UNITS IN LACTATED RINGERS INFUSION - SIMPLE MED: 62.5000 mL/h | INTRAVENOUS | Status: DC | PRN

## 2015-04-20 MED ORDER — LIDOCAINE HCL (PF) 1 % IJ SOLN
INTRAMUSCULAR | Status: DC | PRN
  Administered 2015-04-20: 4 mL
  Administered 2015-04-20: 6 mL via EPIDURAL

## 2015-04-20 MED ORDER — LANOLIN HYDROUS EX OINT: TOPICAL_OINTMENT | CUTANEOUS | Status: DC | PRN

## 2015-04-20 MED ORDER — PENICILLIN G POTASSIUM 5000000 UNITS IJ SOLR
5.0000 10*6.[IU] | Freq: Once | INTRAMUSCULAR | Status: AC
  Administered 2015-04-20: 5 10*6.[IU] via INTRAVENOUS
  Filled 2015-04-20: qty 5

## 2015-04-20 MED ORDER — FENTANYL 2.5 MCG/ML BUPIVACAINE 1/10 % EPIDURAL INFUSION (WH - ANES)
14.0000 mL/h | INTRAMUSCULAR | Status: DC | PRN
  Administered 2015-04-20: 14 mL/h via EPIDURAL

## 2015-04-20 MED ORDER — FENTANYL 2.5 MCG/ML BUPIVACAINE 1/10 % EPIDURAL INFUSION (WH - ANES)
14.0000 mL/h | INTRAMUSCULAR | Status: DC | PRN
  Administered 2015-04-20 (×2): 14 mL/h via EPIDURAL
  Filled 2015-04-20 (×2): qty 125

## 2015-04-20 MED ORDER — WITCH HAZEL-GLYCERIN EX PADS: 1.0000 "application " | MEDICATED_PAD | CUTANEOUS | Status: DC | PRN

## 2015-04-20 MED ORDER — TETANUS-DIPHTH-ACELL PERTUSSIS 5-2.5-18.5 LF-MCG/0.5 IM SUSP: 0.5000 mL | Freq: Once | INTRAMUSCULAR | Status: DC

## 2015-04-20 MED ORDER — ONDANSETRON HCL 4 MG/2ML IJ SOLN: 4.0000 mg | INTRAMUSCULAR | Status: DC | PRN

## 2015-04-20 MED ORDER — FENTANYL 2.5 MCG/ML BUPIVACAINE 1/10 % EPIDURAL INFUSION (WH - ANES): 14.0000 mL/h | INTRAMUSCULAR | Status: DC | PRN

## 2015-04-20 MED ORDER — PENICILLIN G POTASSIUM 5000000 UNITS IJ SOLR
2.5000 10*6.[IU] | INTRAVENOUS | Status: DC
  Administered 2015-04-20 (×3): 2.5 10*6.[IU] via INTRAVENOUS
  Filled 2015-04-20 (×7): qty 2.5

## 2015-04-20 MED ORDER — OXYTOCIN BOLUS FROM INFUSION
500.0000 mL | INTRAVENOUS | Status: DC
  Administered 2015-04-20: 500 mL via INTRAVENOUS

## 2015-04-20 MED ORDER — ONDANSETRON HCL 4 MG/2ML IJ SOLN: 4.0000 mg | Freq: Four times a day (QID) | INTRAMUSCULAR | Status: DC | PRN

## 2015-04-20 MED ORDER — SIMETHICONE 80 MG PO CHEW: 80.0000 mg | CHEWABLE_TABLET | ORAL | Status: DC | PRN

## 2015-04-20 MED ORDER — SENNOSIDES-DOCUSATE SODIUM 8.6-50 MG PO TABS
2.0000 | ORAL_TABLET | ORAL | Status: DC
  Administered 2015-04-21: 2 via ORAL
  Filled 2015-04-20 (×2): qty 2

## 2015-04-20 MED ORDER — OXYCODONE-ACETAMINOPHEN 5-325 MG PO TABS: 1.0000 | ORAL_TABLET | ORAL | Status: DC | PRN

## 2015-04-20 MED ORDER — FENTANYL CITRATE (PF) 100 MCG/2ML IJ SOLN: 100.0000 ug | INTRAMUSCULAR | Status: DC | PRN

## 2015-04-20 MED ORDER — DIPHENHYDRAMINE HCL 25 MG PO CAPS: 25.0000 mg | ORAL_CAPSULE | Freq: Four times a day (QID) | ORAL | Status: DC | PRN

## 2015-04-20 MED ORDER — ACETAMINOPHEN 325 MG PO TABS
650.0000 mg | ORAL_TABLET | ORAL | Status: DC | PRN
Start: 2015-04-20 — End: 2015-04-20

## 2015-04-20 MED ORDER — DIBUCAINE 1 % RE OINT: 1.0000 "application " | TOPICAL_OINTMENT | RECTAL | Status: DC | PRN

## 2015-04-20 MED ORDER — EPHEDRINE 5 MG/ML INJ
10.0000 mg | INTRAVENOUS | Status: DC | PRN
  Filled 2015-04-20: qty 2

## 2015-04-20 MED ORDER — PRENATAL MULTIVITAMIN CH
1.0000 | ORAL_TABLET | Freq: Every day | ORAL | Status: DC
  Administered 2015-04-21: 1 via ORAL
  Filled 2015-04-20: qty 1

## 2015-04-20 MED ORDER — IBUPROFEN 600 MG PO TABS
600.0000 mg | ORAL_TABLET | Freq: Four times a day (QID) | ORAL | Status: DC
  Administered 2015-04-20 – 2015-04-22 (×7): 600 mg via ORAL
  Filled 2015-04-20 (×7): qty 1

## 2015-04-20 MED ORDER — DIPHENHYDRAMINE HCL 50 MG/ML IJ SOLN: 12.5000 mg | INTRAMUSCULAR | Status: DC | PRN

## 2015-04-20 MED ORDER — LACTATED RINGERS IV SOLN
500.0000 mL | INTRAVENOUS | Status: DC | PRN
  Administered 2015-04-20: 500 mL via INTRAVENOUS

## 2015-04-20 MED ORDER — MEASLES, MUMPS & RUBELLA VAC ~~LOC~~ INJ
0.5000 mL | INJECTION | Freq: Once | SUBCUTANEOUS | Status: DC
  Filled 2015-04-20: qty 0.5

## 2015-04-20 MED ORDER — CITRIC ACID-SODIUM CITRATE 334-500 MG/5ML PO SOLN: 30.0000 mL | ORAL | Status: DC | PRN

## 2015-04-20 MED ORDER — ACETAMINOPHEN 325 MG PO TABS: 650.0000 mg | ORAL_TABLET | ORAL | Status: DC | PRN

## 2015-04-20 MED ORDER — OXYCODONE-ACETAMINOPHEN 5-325 MG PO TABS
2.0000 | ORAL_TABLET | ORAL | Status: DC | PRN
Start: 2015-04-20 — End: 2015-04-20

## 2015-04-20 MED ORDER — PHENYLEPHRINE 40 MCG/ML (10ML) SYRINGE FOR IV PUSH (FOR BLOOD PRESSURE SUPPORT)
80.0000 ug | PREFILLED_SYRINGE | INTRAVENOUS | Status: DC | PRN
  Filled 2015-04-20: qty 2
  Filled 2015-04-20: qty 20

## 2015-04-20 MED ORDER — ONDANSETRON HCL 4 MG PO TABS: 4.0000 mg | ORAL_TABLET | ORAL | Status: DC | PRN

## 2015-04-20 MED ORDER — LIDOCAINE HCL (PF) 1 % IJ SOLN
30.0000 mL | INTRAMUSCULAR | Status: DC | PRN
  Filled 2015-04-20: qty 30

## 2015-04-20 MED ORDER — ZOLPIDEM TARTRATE 5 MG PO TABS: 5.0000 mg | ORAL_TABLET | Freq: Every evening | ORAL | Status: DC | PRN

## 2015-04-20 MED ORDER — BENZOCAINE-MENTHOL 20-0.5 % EX AERO
1.0000 "application " | INHALATION_SPRAY | CUTANEOUS | Status: DC | PRN
  Administered 2015-04-20: 1 via TOPICAL
  Filled 2015-04-20: qty 56

## 2015-04-20 NOTE — Anesthesia Procedure Notes (Signed)

## 2015-04-20 NOTE — H&P (Signed)
LABOR ADMISSION HISTORY AND PHYSICAL  Alicia Hutchinson is a 19 y.o. female G1P0000 with IUP at [redacted]w[redacted]d by LMP presenting for active labor. She reports +FM, + contractions, No LOF, no VB (some bloody show).  She plans on breast feeding. She request condoms for birth control.   Dating: By LMP --->  Estimated Date of Delivery: 04/26/15  Sono:   , CWD, normal anatomy, cephalic presentation, 579g, 36% EFW  Clinic [facility] Prenatal Labs  Dating LMP Blood type: B pos  Genetic Screen Quad: negative NIPS:--declines Antibody: Neg  Anatomic US Wnl, incomplete views, repeat at 24h normal Rubella: Immune  GTT Early: Third trimester: 94 RPR: NR  Flu vaccine 11/24/14 HBsAg: Neg  TDaP vaccine  03/01/2015  HIV: NR  GBS  03/29/15  GBS: pos   Contraception unsure- possibly IUD  Pap: n/a  Baby Food Breast   Circumcision Undesired   Pediatrician not yet   Support Person Chasel (pronounced like it is spelled)       Prenatal History/Complications: -No complications  Past Medical History: Past Medical History  Diagnosis Date  . Medical history non-contributory     Past Surgical History: Past Surgical History  Procedure Laterality Date  . No past surgeries      Obstetrical History: OB History    Gravida Para Term Preterm AB TAB SAB Ectopic Multiple Living        Social History: Social History   Social History  . Marital Status: Single    Spouse Name: N/A  . Number of Children: N/A  . Years of Education: N/A   Social History Main Topics  . Smoking status: Never Smoker   . Smokeless tobacco: Never Used  . Alcohol Use: No  . Drug Use: No  . Sexual Activity: Not Currently   Other Topics Concern  . None   Social History Narrative    Family History: Family History  Problem Relation Age of Onset  . Alcohol abuse Neg Hx   . Arthritis Neg Hx   . Asthma  Neg Hx   . Birth defects Neg Hx   . Cancer Neg Hx   . COPD Neg Hx   . Depression Neg Hx   . Diabetes Neg Hx   . Drug abuse Neg Hx   . Early death Neg Hx   . Hearing loss Neg Hx   . Heart disease Neg Hx   . Hyperlipidemia Neg Hx   . Hypertension Neg Hx   . Kidney disease Neg Hx   . Learning disabilities Neg Hx   . Mental illness Neg Hx   . Mental retardation Neg Hx   . Miscarriages / Stillbirths Neg Hx   . Vision loss Neg Hx   . Stroke Neg Hx   . Varicose Veins Neg Hx     Allergies: No Known Allergies  Prescriptions prior to admission  Medication Sig Dispense Refill Last Dose  . oxyCODONE-acetaminophen (ROXICET) 5-325 MG per tablet Take 1 tablet by mouth every 6 (six) hours as needed for severe pain. 5 tablet 0   . prenatal vitamin w/FE, FA (PRENATAL 1 + 1) 27-1 MG TABS tablet Take 1 tablet by mouth daily at 12 noon.   04/18/2015 at Unknown time  . zolpidem (AMBIEN CR) 6.25 MG CR tablet Take 1 tablet (6.25 mg total) by mouth at bedtime as needed for sleep. 3 tablet 0      Review of Systems  All systems reviewed  and negative except as stated in HPI  BP 124/84 mmHg  Pulse 101  Temp(Src) 97.7 F (36.5 C) (Oral)  Resp 16  SpO2 100%  LMP 07/20/2014 (Exact Date) General appearance: alert, cooperative and mild distress Lungs: normal work of breathing Heart: tachycardia Abdomen: soft, non-tender, gravid Pelvic: adequate Extremities: Homans sign is negative, no sign of DVT, edema Presentation: cephalic Fetal monitoring: Baseline: 130 bpm, Variability: Good {> 6 bpm), Accelerations: Reactive and Decelerations: Absent Uterine activity: Frequency: Every 6-8 minutes Dilation: 5 Effacement (%): 100 Station: -2 Exam by:: Remigio Eisenmenger RN   Prenatal labs: ABO, Rh: B/POS/-- (03/10 1327) Antibody: NEG (03/10 1327) Rubella:  Immune RPR: NON REAC (06/02 1554)  HBsAg: NEGATIVE (03/10 1327)  HIV: NONREACTIVE (06/02 1554)  GBS:   Positive 1 hr Glucola 94 Genetic screening  negative Anatomy US normal  Prenatal Transfer Tool  Maternal Diabetes: No Genetic Screening: Normal Maternal Ultrasounds/Referrals: Normal Fetal Ultrasounds or other Referrals:  None Maternal Substance Abuse:  No Significant Maternal Medications:  None Significant Maternal Lab Results: Lab values include: Group B Strep positive  Results for orders placed or performed during the hospital encounter of 04/19/15 (from the past 24 hour(s))  Amnisure rupture of membrane (rom)not at [facility]   Collection Time: 04/19/15  4:15 PM  Result Value Ref Range   Amnisure ROM NEGATIVE     Patient Active Problem List   Diagnosis Date Noted  . Language barrier; speaks Mandarin Chinese 04/05/2015  . 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 Supervision of normal first pregnancy, antepartum 10/28/2014    Assessment: Alicia Hutchinson is a 19 y.o. G1P0000 at [redacted]w[redacted]d here for SOL.   Admit to [facility] #Labor: Expectant management of NSVD #Pain: Plan for epidural #FWB: Category 1 #ID: GBS positive - tx with PCN #MOF: Breast #MOC: Condoms  Caryl Ada, DO 04/20/2015, 1:02 AM PGY-2, [facility]  OB fellow attestation: I have seen and examined this patient; I agree with above documentation in the resident's note.   Alicia Hutchinson is a 19 y.o. G1P0000 here for SOL PE: BP 124/84 mmHg  Pulse 101  Temp(Src) 97.5 F (36.4 C) (Oral)  Resp 16  SpO2 100%  LMP 07/20/2014 (Exact Date) Gen: calm comfortable, NAD Resp: normal effort, no distress Abd: gravid  ROS, labs, PMH reviewed  Plan: Admit to [facility] #Labor: Expectant management of NSVD #Pain: Plan for epidural #FWB: Category 1 #ID: GBS positive - tx with PCN #MOF: Breast #MOC: Condoms  Federico Flake, MD Family Medicine, OB Fellow 04/20/2015, 2:13 AM

## 2015-04-20 NOTE — Anesthesia Preprocedure Evaluation (Signed)

## 2015-04-20 NOTE — Progress Notes (Signed)
Monitors off for epidural placement. 

## 2015-04-21 MED ORDER — IBUPROFEN 600 MG PO TABS: 600.0000 mg | ORAL_TABLET | Freq: Four times a day (QID) | ORAL | Status: DC

## 2015-04-21 MED ORDER — ACETAMINOPHEN 325 MG PO TABS: 650.0000 mg | ORAL_TABLET | ORAL | Status: DC | PRN

## 2015-04-21 NOTE — Lactation Note (Signed)
This note was copied from the chart of Alicia Hutchinson. Lactation Consultation Note  Baby is sleeping in mom's arms.  Feeding attempted but baby was too sleepy. Asked mom to call for latch assessment with the next feeding.  Informed mom that feedings needed to occur at least 8 times in 24 hours.  HAnd expression taught with colostrum visible.  Has handouts on support groups and outpatient services.  Patient Name: Alicia Derica Gaspard Today's Date: 04/21/2015     Maternal Data    Feeding Feeding Type: Breast Fed Length of feed: 0 min  LATCH Score/Interventions Latch: Too sleepy or reluctant, no latch achieved, no sucking elicited.  Audible Swallowing: None  Type of Nipple: Everted at rest and after stimulation  Comfort (Breast/Nipple): Soft / non-tender     Hold (Positioning): No assistance needed to correctly position infant at breast.  LATCH Score: 6  Lactation Tools Discussed/Used     Consult Status Consult Status: Follow-up    Soyla Dryer 04/21/2015, 2:28 PM

## 2015-04-21 NOTE — Discharge Summary (Signed)
Obstetric Discharge Summary  Reason for Admission: onset of labor Prenatal Procedures: none Intrapartum Procedures: spontaneous vaginal delivery Postpartum Procedures: none Complications-Operative and Postpartum: none  At 2:30 PM a viable female was delivered via Vaginal, Spontaneous Delivery (Presentation: Left Occiput Anterior). APGAR: 7, 9; weight 6 lb 9.5 oz (2990 g).  Placenta status: Intact, Spontaneous. Cord: 3 vessels with the following complications: meconium-stained fluid. Cord pH: not obtained  Anesthesia: Epidural  Episiotomy: None Lacerations: 1st degree Suture Repair: 3-0 vicryl for perineal laceration and 4-0 vicryl for right labial laceration Est. Blood Loss (mL): 153  Hospital Course:  Active Problems:   Active labor   Alicia Hutchinson is a 19 y.o. G1P1001 s/p nsvd.  Patient was admitted in active labor and proceeded with nsvd. GBS positive, adequately treated with penicillin. She has postpartum course that was uncomplicated including no problems with ambulating, PO intake, urination, pain, or bleeding. The pt feels ready to go home and  will be discharged with outpatient follow-up.   Today: No acute events overnight.  Pt denies problems with ambulating, voiding or po intake.  She denies nausea or vomiting.  Pain is well controlled.  She has had flatus. She has not had bowel movement.  Lochia Small.  Plan for birth control is  condoms or IUD.  Method of Feeding: breast  Physical Exam:  General: alert, cooperative and appears stated age 24: appropriate Uterine Fundus: firm Incision: n/a DVT Evaluation: No evidence of DVT seen on physical exam.  H/H: Lab Results  Component Value Date/Time   HGB 14.6 04/20/2015 01:36 AM   HCT 41.8 04/20/2015 01:36 AM    Discharge Diagnoses: Term Pregnancy-delivered  Discharge Information: Date: 04/21/2015 Activity: pelvic rest Diet: routine  Medications: PNV and Ibuprofen Breast feeding:  Yes Condition:  stable Instructions: refer to handout Discharge to: home   Discharge Instructions    Call MD for:  difficulty breathing, headache or visual disturbances    Complete by:  As directed      Call MD for:  extreme fatigue    Complete by:  As directed      Call MD for:  hives    Complete by:  As directed      Call MD for:  persistant dizziness or light-headedness    Complete by:  As directed      Call MD for:  persistant nausea and vomiting    Complete by:  As directed      Call MD for:  severe uncontrolled pain    Complete by:  As directed      Call MD for:  temperature >100.4    Complete by:  As directed      Call MD for:    Complete by:  As directed      Diet general    Complete by:  As directed      Sexual acrtivity    Complete by:  As directed   Nothing in the vagina for at least 2 weeks            Medication List    TAKE these medications        acetaminophen 325 MG tablet  Commonly known as:  TYLENOL  Take 2 tablets (650 mg total) by mouth every 4 (four) hours as needed (for pain scale < 4).     ibuprofen 600 MG tablet  Commonly known as:  ADVIL,MOTRIN  Take 1 tablet (600 mg total) by mouth every 6 (six) hours.     prenatal  vitamin w/FE, FA 27-1 MG Tabs tablet  Take 1 tablet by mouth daily at 12 noon.           Follow-up Information    Follow up with [facility] In 6 weeks.   Specialty:  Obstetrics and Gynecology   Contact information:   [address] [place] [address] [phone]      Silvano Bilis ,MD OB Fellow 04/21/2015,7:48 AM

## 2015-04-21 NOTE — Lactation Note (Signed)
This note was copied from the chart of Alicia Chrisanne Mcinroy. Lactation Consultation Note  Assisted mom with latching Simonne Come.  Pellegrino latched easily and suckled deeply.  Explained feeding cues to mom. Encouraged her to feed Linwood 8-12 times in 24 hours.  Explained support groups and outpatient services. Patient Name: Alicia Hutchinson Today's Date: 04/21/2015 Reason for consult: Follow-up assessment   Maternal Data Has patient been taught Hand Expression?: Yes Does the patient have breastfeeding experience prior to this delivery?: No  Feeding Feeding Type: Breast Fed Length of feed: 0 min  LATCH Score/Interventions Latch: Grasps breast easily, tongue down, lips flanged, rhythmical sucking.  Audible Swallowing: Spontaneous and intermittent  Type of Nipple: Everted at rest and after stimulation  Comfort (Breast/Nipple): Soft / non-tender     Hold (Positioning): Assistance needed to correctly position infant at breast and maintain latch. Intervention(s): Breastfeeding basics reviewed;Support Pillows  LATCH Score: 9  Lactation Tools Discussed/Used     Consult Status Consult Status: Follow-up Date: 04/22/15 Follow-up type: In-patient    Soyla Dryer 04/21/2015, 4:05 PM

## 2015-04-21 NOTE — Progress Notes (Signed)
UR chart review completed.  

## 2015-04-22 NOTE — Discharge Summary (Signed)
Obstetric Discharge Summary Reason for Admission: onset of labor Prenatal Procedures: none Intrapartum Procedures: spontaneous vaginal delivery Postpartum Procedures: none Complications-Operative and Postpartum: none  At 2:30 PM a viable female was delivered via Vaginal, Spontaneous Delivery (Presentation: Left Occiput Anterior). APGAR: 7, 9; weight 6 lb 9.5 oz (2990 g).  Placenta status: Intact, Spontaneous. Cord: 3 vessels with the following complications: meconium-stained fluid. Cord pH: not obtained  Anesthesia: Epidural  Episiotomy: None Lacerations: 1st degree Suture Repair: 3-0 vicryl for perineal laceration and 4-0 vicryl for right labial laceration Est. Blood Loss (mL): 153  Hospital Course:  Active Problems:   Active labor   Alicia Hutchinson is a 19 y.o. G1P1001 s/p NSVD.  Patient was admitted in active labor and proceeded with NSVD.  She has postpartum course that was uncomplicated including no problems with ambulating, PO intake, urination, pain, or bleeding. The pt feels ready to go home and  will be discharged with outpatient follow-up.   Today: No acute events overnight.  Pt denies problems with ambulating, voiding or po intake.  She denies nausea or vomiting.  Pain is moderately controlled.  She has had flatus. She has had bowel movement.  Lochia Small.  Plan for birth control is  condoms.  Method of Feeding: Breast  Physical Exam:  General: alert, cooperative and no distress Lochia: appropriate Uterine Fundus: firm DVT Evaluation: No evidence of DVT seen on physical exam.  H/H: Lab Results  Component Value Date/Time   HGB 14.6 04/20/2015 01:36 AM   HCT 41.8 04/20/2015 01:36 AM    Discharge Diagnoses: Term Pregnancy-delivered  Discharge Information: Date: 04/22/2015 Activity: pelvic rest Diet: routine  Medications: PNV, Tylenol #3 and Ibuprofen Breast feeding:  Yes Condition: stable Instructions: refer to handout Discharge to: home   Discharge  Instructions    Call MD for:  difficulty breathing, headache or visual disturbances    Complete by:  As directed      Call MD for:  extreme fatigue    Complete by:  As directed      Call MD for:  hives    Complete by:  As directed      Call MD for:  persistant dizziness or light-headedness    Complete by:  As directed      Call MD for:  persistant nausea and vomiting    Complete by:  As directed      Call MD for:  severe uncontrolled pain    Complete by:  As directed      Call MD for:  temperature >100.4    Complete by:  As directed      Call MD for:    Complete by:  As directed      Diet general    Complete by:  As directed      Sexual acrtivity    Complete by:  As directed   Nothing in the vagina for at least 2 weeks            Medication List    TAKE these medications        acetaminophen 325 MG tablet  Commonly known as:  TYLENOL  Take 2 tablets (650 mg total) by mouth every 4 (four) hours as needed (for pain scale < 4).     ibuprofen 600 MG tablet  Commonly known as:  ADVIL,MOTRIN  Take 1 tablet (600 mg total) by mouth every 6 (six) hours.     prenatal vitamin w/FE, FA 27-1 MG Tabs tablet  Take 1  tablet by mouth daily at 12 noon.           Follow-up Information    Follow up with [facility] In 6 weeks.   Specialty:  Obstetrics and Gynecology   Contact information:   [address] [place] [address] [phone]      Tarri Abernethy ,MD PGY-1 [facility]   04/22/2015,6:52 AM  OB fellow attestation I have seen and examined this patient and agree with above documentation in the resident's note.  Alicia Hutchinson is a 19 y.o. G1P1001 s/p NSVD.   Pain is well controlled.  Plan for birth control is condoms, possible LARC.  Method of Feeding: breast PE:  BP 91/49 mmHg  Pulse 70  Temp(Src) 98 F (36.7 C) (Oral)  Resp 18  Ht 5\' 4"  (1.626 m)  Wt 166 lb (75.297 kg)  BMI 28.48 kg/m2  SpO2 98%  LMP 07/20/2014  (Exact Date)  Breastfeeding? Unknown Fundus firm  Plan: discharge today - postpartum care discussed - f/u clinic in 6 weeks for postpartum visit Federico Flake, MD 1:28 PM

## 2015-04-22 NOTE — Anesthesia Postprocedure Evaluation (Signed)
  Anesthesia Post-op Note  Patient: Alicia Hutchinson  Procedure(s) Performed: * No procedures listed *  Patient Location: Mother/Baby  Anesthesia Type:Epidural  Level of Consciousness: awake, alert , oriented and patient cooperative  Airway and Oxygen Therapy: Patient Spontanous Breathing  Post-op Pain: mild  Post-op Assessment: Patient's Cardiovascular Status Stable, Respiratory Function Stable, No headache, No backache and Patient able to bend at knees              Post-op Vital Signs: Reviewed and stable  Last Vitals:  Filed Vitals:   04/22/15 0559  BP: 91/49  Pulse: 70  Temp: 36.7 C  Resp: 18    Complications: No apparent anesthesia complications

## 2015-04-22 NOTE — Lactation Note (Signed)
This note was copied from the chart of Alicia Hutchinson. Lactation Consultation Note  Patient Name: Alicia Hutchinson Today's Date: 04/22/2015 Reason for consult: Follow-up assessment  Baby 44 hours old, 7% weight loss, Breast feeding Range - 10 -20 mins , 7 wets , 7 stools, Latch scores - 6-5-6-9-9 , Bili check - 8.4 @ 33 hours. Baby at consult [facility] assisted with latch , positioning , depth, after LC had mom go through  The steps for latching reviewed and reinforced several times at consult.  LC had mom massage her breast - hand express ( needed review ) , and pre-pump to make the  Base of the nipple more elastic for a deeper latch , added adequate support . Baby latched in cross cradle  With depth , with assistance and breast compressions until swallows. Per mom comfortable. Multiply swallows and gulps noted, increased with breast compressions. Baby fed 13 mins and released on his own - nipple appeared normal when released. LC assessed both breast - noted swelling at the base of both nipples , left breast > right .  Mom was instructed on the use shells , comfort gels , and hand pump , #24 snug , increased to #27. Sore nipple and engorgement prevention and tx reviewed.  Mother informed of post-discharge support and given phone number to the lactation department, including services for phone call assistance; out-patient appointments; and breastfeeding support group. List of other breastfeeding resources in the community given in the handout. Encouraged mother to call for problems or concerns related to breastfeeding.     Maternal Data Has patient been taught Hand Expression?: Yes  Feeding Feeding Type: Breast Fed Length of feed: 13 min  LATCH Score/Interventions Latch: Grasps breast easily, tongue down, lips flanged, rhythmical sucking. Intervention(s): Skin to skin;Teach feeding cues;Waking techniques Intervention(s): Adjust position;Assist with latch;Breast massage;Breast  compression  Audible Swallowing: Spontaneous and intermittent  Type of Nipple: Everted at rest and after stimulation (swollen areolas )  Comfort (Breast/Nipple): Filling, red/small blisters or bruises, mild/mod discomfort     Hold (Positioning): Assistance needed to correctly position infant at breast and maintain latch. Intervention(s): Breastfeeding basics reviewed;Support Pillows;Position options;Skin to skin  LATCH Score: 8  Lactation Tools Discussed/Used Tools: Shells;Pump;Comfort gels;Flanges Flange Size: 27 Shell Type: Inverted Breast pump type: Manual Pump Review: Milk Storage Initiated by:: MAI  Date initiated:: 04/22/15   Consult Status Consult Status: Complete Date: 04/22/15 Follow-up type: In-patient    Kathrin Greathouse 04/22/2015, 10:58 AM

## 2015-05-30 ENCOUNTER — Encounter: Payer: Self-pay | Admitting: Obstetrics & Gynecology

## 2015-05-30 ENCOUNTER — Ambulatory Visit (INDEPENDENT_AMBULATORY_CARE_PROVIDER_SITE_OTHER): Payer: Self-pay | Admitting: Obstetrics & Gynecology

## 2015-05-30 DIAGNOSIS — Z789 Other specified health status: Secondary | ICD-10-CM

## 2015-05-30 LAB — POCT URINALYSIS DIP (DEVICE)
BILIRUBIN URINE: NEGATIVE
Glucose, UA: NEGATIVE mg/dL
KETONES UR: NEGATIVE mg/dL
LEUKOCYTES UA: NEGATIVE
NITRITE: NEGATIVE
PH: 6 (ref 5.0–8.0)
Protein, ur: NEGATIVE mg/dL
SPECIFIC GRAVITY, URINE: 1.02 (ref 1.005–1.030)
Urobilinogen, UA: 0.2 mg/dL (ref 0.0–1.0)

## 2015-05-30 NOTE — Progress Notes (Signed)
Patient ID: Alicia Hutchinson, female   DOB: December 05, 1995, 19 y.o.   MRN: [id number] Subjective:     Alicia Hutchinson is a 19 y.o. G32P1001 female who presents for a postpartum visit. Speaks Mandarin; Alfonzo Beers used for interpreter She is 5 weeks postpartum following a spontaneous vaginal delivery. I have fully reviewed the prenatal and intrapartum course. The delivery was at 39.1 gestational weeks. Outcome: spontaneous vaginal delivery. Anesthesia: none. Postpartum course has been unremarkable. Baby's course has been unremarkable. Baby is feeding by breast. Bleeding no bleeding. Bowel function is normal. Bladder function is normal. Patient is not sexually active. Contraception method is condoms. Postpartum depression screening: negative.  The following portions of the patient's history were reviewed and updated as appropriate: allergies, current medications, past family history, past medical history, past social history, past surgical history and problem list.  Review of Systems Pertinent items noted in HPI and remainder of comprehensive ROS otherwise negative.   Objective:    BP 110/63 mmHg  Pulse 71  Temp(Src) 98 F (36.7 C) (Oral)  Ht  (1.626 m)  Wt 149 lb (67.586 kg)  BMI 25.56 kg/m2  LMP 05/28/2015  Breastfeeding? Yes  General:  alert and no distress   Breasts:  inspection negative, no nipple discharge or bleeding, no masses or nodularity palpable  Lungs: clear to auscultation bilaterally  Heart:  regular rate and rhythm  Abdomen: soft, non-tender; bowel sounds normal; no masses,  no organomegaly   Vulva:  normal  Vagina: normal vagina, no discharge, exudate, lesion, or erythema  Rectal Exam: Not performed.        Assessment:   Norma; postpartum exam. Pap smear not done at today's visit.   Plan:   1. Contraception: condoms 2. No other concerns 3. Follow up as needed.    Jaynie Collins, MD, FACOG Attending Obstetrician & Gynecologist, [facility] and Center for [facility]

## 2015-10-11 ENCOUNTER — Encounter: Payer: Self-pay | Admitting: *Deleted
